# Patient Record
Sex: Female | Born: 1975 | Race: White | Hispanic: No | Marital: Married | Smoking: Current every day smoker
Health system: Southern US, Community
[De-identification: ages and names within clinical notes are randomized; demographics above are authoritative.]

## PROBLEM LIST (undated history)

## (undated) DIAGNOSIS — M51369 Other intervertebral disc degeneration, lumbar region without mention of lumbar back pain or lower extremity pain: Secondary | ICD-10-CM

## (undated) DIAGNOSIS — E785 Hyperlipidemia, unspecified: Secondary | ICD-10-CM

## (undated) DIAGNOSIS — M5136 Other intervertebral disc degeneration, lumbar region: Secondary | ICD-10-CM

## (undated) DIAGNOSIS — G629 Polyneuropathy, unspecified: Secondary | ICD-10-CM

## (undated) DIAGNOSIS — I10 Essential (primary) hypertension: Secondary | ICD-10-CM

## (undated) HISTORY — DX: Polyneuropathy, unspecified: G62.9

## (undated) HISTORY — DX: Essential (primary) hypertension: I10

## (undated) HISTORY — DX: Other intervertebral disc degeneration, lumbar region: M51.36

## (undated) HISTORY — DX: Hyperlipidemia, unspecified: E78.5

## (undated) HISTORY — DX: Other intervertebral disc degeneration, lumbar region without mention of lumbar back pain or lower extremity pain: M51.369

---

## 2003-10-27 HISTORY — PX: TUBAL LIGATION: SHX77

## 2016-06-16 DIAGNOSIS — E785 Hyperlipidemia, unspecified: Secondary | ICD-10-CM | POA: Insufficient documentation

## 2016-06-16 DIAGNOSIS — M47816 Spondylosis without myelopathy or radiculopathy, lumbar region: Secondary | ICD-10-CM | POA: Insufficient documentation

## 2016-10-21 DIAGNOSIS — Z8673 Personal history of transient ischemic attack (TIA), and cerebral infarction without residual deficits: Secondary | ICD-10-CM | POA: Insufficient documentation

## 2016-10-21 DIAGNOSIS — G459 Transient cerebral ischemic attack, unspecified: Secondary | ICD-10-CM | POA: Insufficient documentation

## 2016-11-13 DIAGNOSIS — G629 Polyneuropathy, unspecified: Secondary | ICD-10-CM | POA: Insufficient documentation

## 2017-02-23 DIAGNOSIS — I872 Venous insufficiency (chronic) (peripheral): Secondary | ICD-10-CM | POA: Insufficient documentation

## 2017-04-29 DIAGNOSIS — F32A Depression, unspecified: Secondary | ICD-10-CM | POA: Insufficient documentation

## 2017-04-29 DIAGNOSIS — F329 Major depressive disorder, single episode, unspecified: Secondary | ICD-10-CM | POA: Insufficient documentation

## 2018-04-06 DIAGNOSIS — Z720 Tobacco use: Secondary | ICD-10-CM | POA: Insufficient documentation

## 2018-11-03 ENCOUNTER — Encounter: Payer: Self-pay | Admitting: Family

## 2018-11-03 ENCOUNTER — Encounter: Payer: Self-pay | Admitting: Neurology

## 2018-11-03 ENCOUNTER — Ambulatory Visit: Payer: Medicaid Other | Admitting: Family

## 2018-11-03 VITALS — BP 146/88 | HR 65 | Temp 97.5°F | Ht 64.5 in | Wt 190.4 lb

## 2018-11-03 DIAGNOSIS — E785 Hyperlipidemia, unspecified: Secondary | ICD-10-CM

## 2018-11-03 DIAGNOSIS — Z720 Tobacco use: Secondary | ICD-10-CM

## 2018-11-03 DIAGNOSIS — F331 Major depressive disorder, recurrent, moderate: Secondary | ICD-10-CM | POA: Diagnosis not present

## 2018-11-03 DIAGNOSIS — M47816 Spondylosis without myelopathy or radiculopathy, lumbar region: Secondary | ICD-10-CM

## 2018-11-03 DIAGNOSIS — G459 Transient cerebral ischemic attack, unspecified: Secondary | ICD-10-CM | POA: Diagnosis not present

## 2018-11-03 DIAGNOSIS — I10 Essential (primary) hypertension: Secondary | ICD-10-CM

## 2018-11-03 MED ORDER — AMITRIPTYLINE HCL 25 MG PO TABS: 25.0000 mg | ORAL_TABLET | Freq: Every evening | ORAL | 1 refills | Status: DC | PRN

## 2018-11-03 MED ORDER — CYCLOBENZAPRINE HCL 10 MG PO TABS: 10.0000 mg | ORAL_TABLET | Freq: Every day | ORAL | 2 refills | Status: DC

## 2018-11-03 MED ORDER — HYDROCHLOROTHIAZIDE 12.5 MG PO TABS: 12.5000 mg | ORAL_TABLET | Freq: Every day | ORAL | 3 refills | Status: DC

## 2018-11-03 MED ORDER — CLOPIDOGREL BISULFATE 75 MG PO TABS: 75.0000 mg | ORAL_TABLET | Freq: Every day | ORAL | 1 refills | Status: DC

## 2018-11-03 NOTE — Patient Instructions (Signed)
Dizziness Dizziness is a common problem. It is a feeling of unsteadiness or light-headedness. You may feel like you are about to faint. Dizziness can lead to injury if you stumble or fall. Anyone can become dizzy, but dizziness is more common in older adults. This condition can be caused by a number of things, including medicines, dehydration, or illness. Follow these instructions at home: Eating and drinking  Drink enough fluid to keep your urine clear or pale yellow. This helps to keep you from becoming dehydrated. Try to drink more clear fluids, such as water.  Do not drink alcohol.  Limit your caffeine intake if told to do so by your health care provider. Check ingredients and nutrition facts to see if a food or beverage contains caffeine.  Limit your salt (sodium) intake if told to do so by your health care provider. Check ingredients and nutrition facts to see if a food or beverage contains sodium. Activity  Avoid making quick movements. ? Rise slowly from chairs and steady yourself until you feel okay. ? In the morning, first sit up on the side of the bed. When you feel okay, stand slowly while you hold onto something until you know that your balance is fine.  If you need to stand in one place for a long time, move your legs often. Tighten and relax the muscles in your legs while you are standing.  Do not drive or use heavy machinery if you feel dizzy.  Avoid bending down if you feel dizzy. Place items in your home so that they are easy for you to reach without leaning over. Lifestyle  Do not use any products that contain nicotine or tobacco, such as cigarettes and e-cigarettes. If you need help quitting, ask your health care provider.  Try to reduce your stress level by using methods such as yoga or meditation. Talk with your health care provider if you need help to manage your stress. General instructions  Watch your dizziness for any changes.  Take over-the-counter and  prescription medicines only as told by your health care provider. Talk with your health care provider if you think that your dizziness is caused by a medicine that you are taking.  Tell a friend or a family member that you are feeling dizzy. If he or she notices any changes in your behavior, have this person call your health care provider.  Keep all follow-up visits as told by your health care provider. This is important. Contact a health care provider if:  Your dizziness does not go away.  Your dizziness or light-headedness gets worse.  You feel nauseous.  You have reduced hearing.  You have new symptoms.  You are unsteady on your feet or you feel like the room is spinning. Get help right away if:  You vomit or have diarrhea and are unable to eat or drink anything.  You have problems talking, walking, swallowing, or using your arms, hands, or legs.  You feel generally weak.  You are not thinking clearly or you have trouble forming sentences. It may take a friend or family member to notice this.  You have chest pain, abdominal pain, shortness of breath, or sweating.  Your vision changes.  You have any bleeding.  You have a severe headache.  You have neck pain or a stiff neck.  You have a fever. These symptoms may represent a serious problem that is an emergency. Do not wait to see if the symptoms will go away. Get medical help   right away. Call your local emergency services (911 in the [place] Do not drive yourself to the hospital. Summary  Dizziness is a feeling of unsteadiness or light-headedness. This condition can be caused by a number of things, including medicines, dehydration, or illness.  Anyone can become dizzy, but dizziness is more common in older adults.  Drink enough fluid to keep your urine clear or pale yellow. Do not drink alcohol.  Avoid making quick movements if you feel dizzy. Monitor your dizziness for any changes. This information is not intended to  replace advice given to you by your health care provider. Make sure you discuss any questions you have with your health care provider. Document Released: 04/07/2001 Document Revised: 11/14/2016 Document Reviewed: 11/14/2016 Elsevier Interactive Patient Education  2019 [employer]. Hypertension Hypertension, commonly called high blood pressure, is when the force of blood pumping through the arteries is too strong. The arteries are the blood vessels that carry blood from the heart throughout the body. Hypertension forces the heart to work harder to pump blood and may cause arteries to become narrow or stiff. Having untreated or uncontrolled hypertension can cause heart attacks, strokes, kidney disease, and other problems. A blood pressure reading consists of a higher number over a lower number. Ideally, your blood pressure should be below 120/80. The first ("top") number is called the systolic pressure. It is a measure of the pressure in your arteries as your heart beats. The second ("bottom") number is called the diastolic pressure. It is a measure of the pressure in your arteries as the heart relaxes. What are the causes? The cause of this condition is not known. What increases the risk? Some risk factors for high blood pressure are under your control. Others are not. Factors you can change  Smoking.  Having type 2 diabetes mellitus, high cholesterol, or both.  Not getting enough exercise or physical activity.  Being overweight.  Having too much fat, sugar, calories, or salt (sodium) in your diet.  Drinking too much alcohol. Factors that are difficult or impossible to change  Having chronic kidney disease.  Having a family history of high blood pressure.  Age. Risk increases with age.  Race. You may be at higher risk if you are African-American.  Gender. Men are at higher risk than women before age 10. After age 77, women are at higher risk than men.  Having obstructive sleep  apnea.  Stress. What are the signs or symptoms? Extremely high blood pressure (hypertensive crisis) may cause:  Headache.  Anxiety.  Shortness of breath.  Nosebleed.  Nausea and vomiting.  Severe chest pain.  Jerky movements you cannot control (seizures). How is this diagnosed? This condition is diagnosed by measuring your blood pressure while you are seated, with your arm resting on a surface. The cuff of the blood pressure monitor will be placed directly against the skin of your upper arm at the level of your heart. It should be measured at least twice using the same arm. Certain conditions can cause a difference in blood pressure between your right and left arms. Certain factors can cause blood pressure readings to be lower or higher than normal (elevated) for a short period of time:  When your blood pressure is higher when you are in a health care provider's office than when you are at home, this is called white coat hypertension. Most people with this condition do not need medicines.  When your blood pressure is higher at home than when you are in  a health care provider's office, this is called masked hypertension. Most people with this condition may need medicines to control blood pressure. If you have a high blood pressure reading during one visit or you have normal blood pressure with other risk factors:  You may be asked to return on a different day to have your blood pressure checked again.  You may be asked to monitor your blood pressure at home for 1 week or longer. If you are diagnosed with hypertension, you may have other blood or imaging tests to help your health care provider understand your overall risk for other conditions. How is this treated? This condition is treated by making healthy lifestyle changes, such as eating healthy foods, exercising more, and reducing your alcohol intake. Your health care provider may prescribe medicine if lifestyle changes are not  enough to get your blood pressure under control, and if:  Your systolic blood pressure is above 130.  Your diastolic blood pressure is above 80. Your personal target blood pressure may vary depending on your medical conditions, your age, and other factors. Follow these instructions at home: Eating and drinking   Eat a diet that is high in fiber and potassium, and low in sodium, added sugar, and fat. An example eating plan is called the DASH (Dietary Approaches to Stop Hypertension) diet. To eat this way: ? Eat plenty of fresh fruits and vegetables. Try to fill half of your plate at each meal with fruits and vegetables. ? Eat whole grains, such as whole wheat pasta, brown rice, or whole grain bread. Fill about one quarter of your plate with whole grains. ? Eat or drink low-fat dairy products, such as skim milk or low-fat yogurt. ? Avoid fatty cuts of meat, processed or cured meats, and poultry with skin. Fill about one quarter of your plate with lean proteins, such as fish, chicken without skin, beans, eggs, and tofu. ? Avoid premade and processed foods. These tend to be higher in sodium, added sugar, and fat.  Reduce your daily sodium intake. Most people with hypertension should eat less than 1,500 mg of sodium a day.  Limit alcohol intake to no more than 1 drink a day for nonpregnant women and 2 drinks a day for men. One drink equals 12 oz of beer, 5 oz of wine, or 1 oz of hard liquor. Lifestyle   Work with your health care provider to maintain a healthy body weight or to lose weight. Ask what an ideal weight is for you.  Get at least 30 minutes of exercise that causes your heart to beat faster (aerobic exercise) most days of the week. Activities may include walking, swimming, or biking.  Include exercise to strengthen your muscles (resistance exercise), such as pilates or lifting weights, as part of your weekly exercise routine. Try to do these types of exercises for 30 minutes at least  3 days a week.  Do not use any products that contain nicotine or tobacco, such as cigarettes and e-cigarettes. If you need help quitting, ask your health care provider.  Monitor your blood pressure at home as told by your health care provider.  Keep all follow-up visits as told by your health care provider. This is important. Medicines  Take over-the-counter and prescription medicines only as told by your health care provider. Follow directions carefully. Blood pressure medicines must be taken as prescribed.  Do not skip doses of blood pressure medicine. Doing this puts you at risk for problems and can make the  medicine less effective.  Ask your health care provider about side effects or reactions to medicines that you should watch for. Contact a health care provider if:  You think you are having a reaction to a medicine you are taking.  You have headaches that keep coming back (recurring).  You feel dizzy.  You have swelling in your ankles.  You have trouble with your vision. Get help right away if:  You develop a severe headache or confusion.  You have unusual weakness or numbness.  You feel faint.  You have severe pain in your chest or abdomen.  You vomit repeatedly.  You have trouble breathing. Summary  Hypertension is when the force of blood pumping through your arteries is too strong. If this condition is not controlled, it may put you at risk for serious complications.  Your personal target blood pressure may vary depending on your medical conditions, your age, and other factors. For most people, a normal blood pressure is less than 120/80.  Hypertension is treated with lifestyle changes, medicines, or a combination of both. Lifestyle changes include weight loss, eating a healthy, low-sodium diet, exercising more, and limiting alcohol. This information is not intended to replace advice given to you by your health care provider. Make sure you discuss any questions  you have with your health care provider. Document Released: 10/12/2005 Document Revised: 09/09/2016 Document Reviewed: 09/09/2016 Elsevier Interactive Patient Education  2019 [employer]

## 2018-11-03 NOTE — Progress Notes (Signed)
Subjective:    Patient ID: Ann Anderson, female    DOB: 1975-11-30, 43 y.o.   MRN: [id number]  Chief Complaint  Patient presents with  . New Patient (Initial Visit)   Pt presents to the office today to establish care. Pt has seen a Neurologists in the past for hx TIA, but has not seen one recently due to insurance.   She had her pap and mammogram completed last year.  Depression         This is a chronic problem.  The current episode started more than 1 year ago.   The onset quality is gradual.   The problem occurs intermittently.  The problem has been waxing and waning since onset.  Associated symptoms include helplessness, hopelessness, irritable, restlessness, decreased interest and sad.  Associated symptoms include no headaches. Hyperlipidemia  This is a chronic problem. The current episode started more than 1 year ago. The problem is controlled. Recent lipid tests were reviewed and are normal. Exacerbating diseases include obesity. The current treatment provides no improvement of lipids. Risk factors for coronary artery disease include dyslipidemia and hypertension.  Arthritis  Presents for follow-up visit. She complains of pain and stiffness. The symptoms have been worsening. Affected locations include the right MCP and left MCP (back). Her pain is at a severity of 5/10.  Hypertension  The current episode started more than 1 month ago. The problem has been waxing and waning since onset. The problem is uncontrolled. Associated symptoms include malaise/fatigue and peripheral edema (at times). Pertinent negatives include no headaches. Past treatments include diuretics. The current treatment provides mild improvement.      Review of Systems  Constitutional: Positive for malaise/fatigue.  Musculoskeletal: Positive for arthritis and stiffness.  Neurological: Negative for headaches.  Psychiatric/Behavioral: Positive for depression.  All other systems reviewed and are negative.   Family  History  Problem Relation Age of Onset  . Cancer Mother        breast  . Heart disease Father     Social History   Socioeconomic History  . Marital status: Divorced    Spouse name: Not on file  . Number of children: Not on file  . Years of education: Not on file  . Highest education level: Not on file  Occupational History  . Not on file  Social Needs  . Financial resource strain: Not on file  . Food insecurity:    Worry: Not on file    Inability: Not on file  . Transportation needs:    Medical: Not on file    Non-medical: Not on file  Tobacco Use  . Smoking status: Current Every Day Smoker    Types: Cigarettes  . Smokeless tobacco: Never Used  Substance and Sexual Activity  . Alcohol use: Yes    Comment: Social  . Drug use: Never  . Sexual activity: Not on file  Lifestyle  . Physical activity:    Days per week: Not on file    Minutes per session: Not on file  . Stress: Not on file  Relationships  . Social connections:    Talks on phone: Not on file    Gets together: Not on file    Attends religious service: Not on file    Active member of club or organization: Not on file    Attends meetings of clubs or organizations: Not on file    Relationship status: Not on file  Other Topics Concern  . Not on file  Social History  Narrative  . Not on file       Objective:   Physical Exam Vitals signs reviewed.  Constitutional:      General: She is irritable. She is not in acute distress.    Appearance: She is well-developed.  HENT:     Head: Normocephalic and atraumatic.     Right Ear: External ear normal.  Eyes:     Pupils: Pupils are equal, round, and reactive to light.  Neck:     Musculoskeletal: Normal range of motion and neck supple.     Thyroid: No thyromegaly.  Cardiovascular:     Rate and Rhythm: Normal rate and regular rhythm.     Heart sounds: Normal heart sounds. No murmur.  Pulmonary:     Effort: Pulmonary effort is normal. No respiratory  distress.     Breath sounds: Normal breath sounds. No wheezing.  Abdominal:     General: Bowel sounds are normal. There is no distension.     Palpations: Abdomen is soft.     Tenderness: There is no abdominal tenderness.  Musculoskeletal: Normal range of motion.        General: No tenderness.  Skin:    General: Skin is warm and dry.  Neurological:     Mental Status: She is alert and oriented to person, place, and time.     Cranial Nerves: No cranial nerve deficit.     Deep Tendon Reflexes: Reflexes are normal and symmetric.  Psychiatric:        Behavior: Behavior normal.        Thought Content: Thought content normal.        Judgment: Judgment normal.       BP (!) 146/88   Pulse 65   Temp (!) 97.5 F (36.4 C) (Oral)   Ht 5' 4.5" (1.638 m)   Wt 190 lb 6.4 oz (86.4 kg)   LMP 10/30/2018   BMI 32.18 kg/m      Assessment & Plan:  Ann Anderson comes in today with chief complaint of New Patient (Initial Visit)   Diagnosis and orders addressed:  1. TIA (transient ischemic attack) - CMP14+EGFR - CBC with Differential/Platelet - Ambulatory referral to Neurology  2. Arthritis, lumbar spine - CMP14+EGFR - CBC with Differential/Platelet  3. Hyperlipidemia, unspecified hyperlipidemia type - CMP14+EGFR - CBC with Differential/Platelet - Lipid panel  4. Moderate episode of recurrent major depressive disorder ([place]) - CMP14+EGFR - CBC with Differential/Platelet - Ambulatory referral to Psychiatry  5. Tobacco abuse - CMP14+EGFR - CBC with Differential/Platelet  6. Essential hypertension We will stop lasix since she is only taking every other day and start HCTZ 12.5 mg -Daily blood pressure log given with instructions on how to fill out and told to bring to next visit -Dash diet information given -Exercise encouraged - Stress Management  -Continue current meds -RTO in 2 weeks  - CMP14+EGFR - hydrochlorothiazide (HYDRODIURIL) 12.5 MG tablet; Take 1 tablet (12.5 mg  total) by mouth daily.  Dispense: 90 tablet; Refill: 3   Labs pending Health Maintenance reviewed Diet and exercise encouraged  Follow up plan: 2 weeks to recheck HTN   [name], FNP

## 2018-11-04 ENCOUNTER — Other Ambulatory Visit: Payer: Self-pay | Admitting: Family

## 2018-11-04 LAB — CMP14+EGFR
ALT: 17 IU/L (ref 0–32)
AST: 14 IU/L (ref 0–40)
Albumin/Globulin Ratio: 1.8 (ref 1.2–2.2)
Albumin: 4.4 g/dL (ref 3.5–5.5)
Alkaline Phosphatase: 71 IU/L (ref 39–117)
BILIRUBIN TOTAL: 0.4 mg/dL (ref 0.0–1.2)
BUN/Creatinine Ratio: 15 (ref 9–23)
BUN: 12 mg/dL (ref 6–24)
CO2: 21 mmol/L (ref 20–29)
Calcium: 9.1 mg/dL (ref 8.7–10.2)
Chloride: 104 mmol/L (ref 96–106)
Creatinine, Ser: 0.82 mg/dL (ref 0.57–1.00)
GFR calc Af Amer: 101 mL/min/{1.73_m2} (ref >59)
GFR calc non Af Amer: 88 mL/min/{1.73_m2} (ref >59)
Globulin, Total: 2.4 g/dL (ref 1.5–4.5)
Glucose: 89 mg/dL (ref 65–99)
POTASSIUM: 4.5 mmol/L (ref 3.5–5.2)
SODIUM: 142 mmol/L (ref 134–144)
Total Protein: 6.8 g/dL (ref 6.0–8.5)

## 2018-11-04 LAB — CBC WITH DIFFERENTIAL/PLATELET
BASOS ABS: 0.1 10*3/uL (ref 0.0–0.2)
Basos: 1 %
EOS (ABSOLUTE): 0.3 10*3/uL (ref 0.0–0.4)
Eos: 4 %
Hematocrit: 38.7 % (ref 34.0–46.6)
Hemoglobin: 13.3 g/dL (ref 11.1–15.9)
Immature Grans (Abs): 0 10*3/uL (ref 0.0–0.1)
Immature Granulocytes: 0 %
Lymphocytes Absolute: 2.5 10*3/uL (ref 0.7–3.1)
Lymphs: 37 %
MCH: 28 pg (ref 26.6–33.0)
MCHC: 34.4 g/dL (ref 31.5–35.7)
MCV: 82 fL (ref 79–97)
Monocytes Absolute: 0.5 10*3/uL (ref 0.1–0.9)
Monocytes: 7 %
Neutrophils Absolute: 3.5 10*3/uL (ref 1.4–7.0)
Neutrophils: 51 %
Platelets: 266 10*3/uL (ref 150–450)
RBC: 4.75 x10E6/uL (ref 3.77–5.28)
RDW: 13.8 % (ref 11.7–15.4)
WBC: 6.9 10*3/uL (ref 3.4–10.8)

## 2018-11-04 LAB — LIPID PANEL
Chol/HDL Ratio: 5.6 ratio — ABNORMAL HIGH (ref 0.0–4.4)
Cholesterol, Total: 234 mg/dL — ABNORMAL HIGH (ref 100–199)
HDL: 42 mg/dL (ref >39)
LDL Calculated: 149 mg/dL — ABNORMAL HIGH (ref 0–99)
Triglycerides: 215 mg/dL — ABNORMAL HIGH (ref 0–149)
VLDL Cholesterol Cal: 43 mg/dL — ABNORMAL HIGH (ref 5–40)

## 2018-11-04 MED ORDER — ATORVASTATIN CALCIUM 20 MG PO TABS: 20.0000 mg | ORAL_TABLET | Freq: Every day | ORAL | 3 refills | Status: DC

## 2018-11-17 ENCOUNTER — Encounter: Payer: Self-pay | Admitting: Family

## 2018-11-17 ENCOUNTER — Ambulatory Visit (INDEPENDENT_AMBULATORY_CARE_PROVIDER_SITE_OTHER): Payer: Medicaid Other | Admitting: Family

## 2018-11-17 VITALS — BP 118/80 | HR 72 | Temp 97.3°F | Ht 64.5 in | Wt 190.6 lb

## 2018-11-17 DIAGNOSIS — Z720 Tobacco use: Secondary | ICD-10-CM | POA: Diagnosis not present

## 2018-11-17 DIAGNOSIS — F331 Major depressive disorder, recurrent, moderate: Secondary | ICD-10-CM | POA: Diagnosis not present

## 2018-11-17 DIAGNOSIS — G459 Transient cerebral ischemic attack, unspecified: Secondary | ICD-10-CM

## 2018-11-17 DIAGNOSIS — E785 Hyperlipidemia, unspecified: Secondary | ICD-10-CM

## 2018-11-17 DIAGNOSIS — M47816 Spondylosis without myelopathy or radiculopathy, lumbar region: Secondary | ICD-10-CM

## 2018-11-17 LAB — BMP8+EGFR
BUN/Creatinine Ratio: 13 (ref 9–23)
BUN: 10 mg/dL (ref 6–24)
CO2: 22 mmol/L (ref 20–29)
Calcium: 9 mg/dL (ref 8.7–10.2)
Chloride: 98 mmol/L (ref 96–106)
Creatinine, Ser: 0.76 mg/dL (ref 0.57–1.00)
GFR calc Af Amer: 111 mL/min/{1.73_m2} (ref >59)
GFR calc non Af Amer: 96 mL/min/{1.73_m2} (ref >59)
Glucose: 90 mg/dL (ref 65–99)
Potassium: 3.8 mmol/L (ref 3.5–5.2)
Sodium: 138 mmol/L (ref 134–144)

## 2018-11-17 NOTE — Progress Notes (Signed)
Subjective:    Patient ID: Ann Anderson, female    DOB: [birth year], 43 y.o.   MRN: [id number]  Chief Complaint  Patient presents with  . Medical Management of Chronic Issues    two week recheck  . forms from [facility]   PT presents to the office today to recheck HTN and to completed [employer] paperwork. She states she had a TIA in 2018 and has to complete the paper work due to this.  Hypertension  This is a chronic problem. The current episode started more than 1 year ago. The problem has been resolved since onset. The problem is controlled. Associated symptoms include malaise/fatigue and peripheral edema ("at times"). Pertinent negatives include no headaches. The current treatment provides moderate improvement.  Depression         This is a chronic problem.  The current episode started more than 1 year ago.   The onset quality is gradual.   The problem occurs intermittently.  Associated symptoms include fatigue, restlessness and decreased interest.  Associated symptoms include no headaches. Back Pain  This is a chronic problem. The current episode started more than 1 year ago. The problem occurs intermittently. The problem has been waxing and waning since onset. The pain is present in the lumbar spine. The pain is moderate. Associated symptoms include weakness. Pertinent negatives include no headaches.      Review of Systems  Constitutional: Positive for fatigue and malaise/fatigue.  Musculoskeletal: Positive for back pain.  Neurological: Positive for weakness. Negative for headaches.  Psychiatric/Behavioral: Positive for depression.  All other systems reviewed and are negative.      Objective:   Physical Exam Vitals signs reviewed.  Constitutional:      General: She is not in acute distress.    Appearance: She is well-developed.  HENT:     Head: Normocephalic and atraumatic.     Right Ear: Tympanic membrane normal.     Left Ear: Tympanic membrane normal.  Eyes:     Pupils: Pupils are  equal, round, and reactive to light.  Neck:     Musculoskeletal: Normal range of motion and neck supple.     Thyroid: No thyromegaly.  Cardiovascular:     Rate and Rhythm: Normal rate and regular rhythm.     Heart sounds: Normal heart sounds. No murmur.  Pulmonary:     Effort: Pulmonary effort is normal. No respiratory distress.     Breath sounds: Normal breath sounds. No wheezing.  Abdominal:     General: Bowel sounds are normal. There is no distension.     Palpations: Abdomen is soft.     Tenderness: There is no abdominal tenderness.  Musculoskeletal: Normal range of motion.        General: No tenderness.  Skin:    General: Skin is warm and dry.  Neurological:     Mental Status: She is alert and oriented to person, place, and time.     Cranial Nerves: No cranial nerve deficit.     Deep Tendon Reflexes: Reflexes are normal and symmetric.  Psychiatric:        Behavior: Behavior normal.        Thought Content: Thought content normal.        Judgment: Judgment normal.       BP 118/80   Pulse 72   Temp (!) 97.3 F (36.3 C) (Oral)   Ht 5' 4.5" (1.638 m)   Wt 190 lb 9.6 oz (86.5 kg)   LMP 10/30/2018  BMI 32.21 kg/m      Assessment & Plan:  Ann Anderson comes in today with chief complaint of Medical Management of Chronic Issues (two week recheck) and forms from [employer]   Diagnosis and orders addressed:  1. TIA (transient ischemic attack) - BMP8+EGFR  2. Hyperlipidemia, unspecified hyperlipidemia type - BMP8+EGFR  3. Moderate episode of recurrent major depressive disorder ([facility]) - BMP8+EGFR  4. Tobacco abuse  - BMP8+EGFR  5. Arthritis, lumbar spine - BMP8+EGFR   Labs pending DMV paperwork completed Health Maintenance reviewed Diet and exercise encouraged  Follow up plan: 6 months   Christy Hawks, FNP  

## 2018-11-17 NOTE — Patient Instructions (Signed)
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
## 2018-12-26 ENCOUNTER — Encounter (HOSPITAL_COMMUNITY): Payer: Self-pay | Admitting: Psychiatry

## 2018-12-26 ENCOUNTER — Ambulatory Visit (INDEPENDENT_AMBULATORY_CARE_PROVIDER_SITE_OTHER): Payer: Medicaid Other | Admitting: Psychiatry

## 2018-12-26 VITALS — BP 127/85 | HR 73 | Ht 64.5 in | Wt 195.0 lb

## 2018-12-26 DIAGNOSIS — F331 Major depressive disorder, recurrent, moderate: Secondary | ICD-10-CM | POA: Insufficient documentation

## 2018-12-26 MED ORDER — DULOXETINE HCL 60 MG PO CPEP: 60.0000 mg | ORAL_CAPSULE | Freq: Every day | ORAL | 0 refills | Status: DC

## 2018-12-26 MED ORDER — DULOXETINE HCL 30 MG PO CPEP: 30.0000 mg | ORAL_CAPSULE | Freq: Every day | ORAL | 0 refills | Status: DC

## 2018-12-26 NOTE — Patient Instructions (Signed)
1. Discontinue amitriptyline  2. Start duloxetine 30 mg daily for one week, then 60 mg daily  3. Referral to therapy 4. Return to clinic in one month for 30 mins 5. Check TSH by your primary care doctor

## 2019-01-09 ENCOUNTER — Ambulatory Visit: Payer: Self-pay | Admitting: Neurology
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
## 2019-01-20 ENCOUNTER — Ambulatory Visit: Payer: Self-pay | Admitting: Neurology

## 2019-01-26 ENCOUNTER — Encounter (HOSPITAL_COMMUNITY): Payer: Self-pay | Admitting: Psychiatry

## 2019-01-26 ENCOUNTER — Other Ambulatory Visit: Payer: Self-pay

## 2019-01-27 NOTE — Progress Notes (Signed)
This encounter was created in error - please disregard.

## 2019-01-30 ENCOUNTER — Other Ambulatory Visit: Payer: Self-pay

## 2019-01-30 ENCOUNTER — Ambulatory Visit (INDEPENDENT_AMBULATORY_CARE_PROVIDER_SITE_OTHER): Payer: Self-pay | Admitting: Psychiatry

## 2019-01-30 ENCOUNTER — Encounter (HOSPITAL_COMMUNITY): Payer: Self-pay | Admitting: Psychiatry

## 2019-01-30 DIAGNOSIS — F331 Major depressive disorder, recurrent, moderate: Secondary | ICD-10-CM
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
## 2019-03-13 ENCOUNTER — Ambulatory Visit (INDEPENDENT_AMBULATORY_CARE_PROVIDER_SITE_OTHER): Payer: Self-pay | Admitting: Family

## 2019-03-13 ENCOUNTER — Other Ambulatory Visit: Payer: Self-pay

## 2019-03-13 ENCOUNTER — Encounter: Payer: Self-pay | Admitting: Family

## 2019-03-13 DIAGNOSIS — R251 Tremor, unspecified: Secondary | ICD-10-CM

## 2019-03-13 DIAGNOSIS — F331 Major depressive disorder, recurrent, moderate: Secondary | ICD-10-CM

## 2019-03-13 DIAGNOSIS — F411 Generalized anxiety disorder: Secondary | ICD-10-CM

## 2019-03-13 DIAGNOSIS — I10 Essential (primary) hypertension: Secondary | ICD-10-CM

## 2019-03-13 MED ORDER — DULOXETINE HCL 30 MG PO CPEP: 30.0000 mg | ORAL_CAPSULE | Freq: Every day | ORAL | 0 refills | Status: DC

## 2019-03-13 MED ORDER — AMITRIPTYLINE HCL 25 MG PO TABS: 25.0000 mg | ORAL_TABLET | Freq: Every evening | ORAL | 1 refills | Status: DC | PRN

## 2019-03-13 MED ORDER — ESCITALOPRAM OXALATE 5 MG PO TABS: ORAL_TABLET | ORAL | 0 refills | Status: DC

## 2019-03-13 MED ORDER — HYDROCHLOROTHIAZIDE 12.5 MG PO TABS: 12.5000 mg | ORAL_TABLET | Freq: Every day | ORAL | 3 refills | Status: DC

## 2019-03-13 MED ORDER — CLOPIDOGREL BISULFATE 75 MG PO TABS: 75.0000 mg | ORAL_TABLET | Freq: Every day | ORAL | 1 refills | Status: DC

## 2019-03-13 MED ORDER — ATORVASTATIN CALCIUM 20 MG PO TABS: 20.0000 mg | ORAL_TABLET | Freq: Every day | ORAL | 3 refills | Status: DC

## 2019-03-13 NOTE — Progress Notes (Signed)
Virtual Visit via telephone Note  I connected with Ann Anderson on 03/13/19 at 12:04 pm by telephone and verified that I am speaking with the correct person using two identifiers. Ann Anderson is currently located at home  and no one is currently with her during visit. The provider, Jannifer Rodneyhristy Tamakia Porto, FNP is located in their office at time of visit.  I discussed the limitations, risks, security and privacy concerns of performing an evaluation and management service by telephone and the availability of in person appointments. I also discussed with the patient that there may be a patient responsible charge related to this service. The patient expressed understanding and agreed to proceed.   History and Present Illness:  Pt calls to the office today with intermittent hand shaking and nervousness that started about a month ago. She states over the last few days it has become worse and she feels like her entire body is shaking and has a "nervous feeling". She states she has not been able to sleep well over the past few nights because of this.   She started taking Cymbalta a little over a month ago. [facility].  Depression         This is a chronic problem.  The current episode started more than 1 month ago.   The onset quality is gradual.   The problem occurs intermittently.  Associated symptoms include fatigue, helplessness, hopelessness, irritable, restlessness, decreased interest and sad.  Past treatments include SNRIs - Serotonin and norepinephrine reuptake inhibitors.  Compliance with treatment is good. Hypertension  This is a chronic problem. The current episode started more than 1 year ago. The problem has been resolved since onset. The problem is controlled. Pertinent negatives include no peripheral edema or shortness of breath. Risk factors for coronary artery disease include dyslipidemia, obesity and sedentary lifestyle. The current treatment  provides moderate improvement.        Review of Systems  Constitutional: Positive for fatigue.  Respiratory: Negative for shortness of breath.   Psychiatric/Behavioral: Positive for depression.  All other systems reviewed and are negative.    Observations/Objective: No SOB or distress   Assessment and Plan: Ann Anderson comes in today with chief complaint of No chief complaint on file.   Diagnosis and orders addressed:  1. MDD (major depressive disorder), recurrent episode, moderate (HCC) - DULoxetine (CYMBALTA) 30 MG capsule; Take 1 capsule (30 mg total) by mouth daily.  Dispense: 30 capsule; Refill: 0 - escitalopram (LEXAPRO) 5 MG tablet; Take 1 tablet (5 mg total) by mouth daily for 7 days, THEN 2 tablets (10 mg total) daily for 14 days.  Dispense: 35 tablet; Refill: 0  2. Tremor  3. GAD (generalized anxiety disorder)   After discussion, her tremors and increased anxiety started after starting Cymbalta. We will decrease to 30 mg over for the next 2 weeks and then stop. She will start Lexapro 5 mg for one week and then increase to 10 mg.  Stress management discussed  Follow up in 6 weeks       I discussed the assessment and treatment plan with the patient. The patient was provided an opportunity to ask questions and all were answered. The patient agreed with the plan and demonstrated an understanding of the instructions.   The patient was advised to call back or seek an in-person evaluation if the symptoms worsen or if the condition fails to improve as anticipated.  The above assessment and management  plan was discussed with the patient. The patient verbalized understanding of and has agreed to the management plan. Patient is aware to call the clinic if symptoms persist or worsen. Patient is aware when to return to the clinic for a follow-up visit. Patient educated on when it is appropriate to go to the emergency department.   Time call ended:  12:28 pm  I provided 24  minutes of non-face-to-face time during this encounter.    [name], FNP

## 2019-03-28 ENCOUNTER — Ambulatory Visit (INDEPENDENT_AMBULATORY_CARE_PROVIDER_SITE_OTHER): Payer: Medicaid Other | Admitting: Family

## 2019-03-28 ENCOUNTER — Encounter: Payer: Self-pay | Admitting: Family

## 2019-03-28 ENCOUNTER — Other Ambulatory Visit: Payer: Self-pay

## 2019-03-28 DIAGNOSIS — Z09 Encounter for follow-up examination after completed treatment for conditions other than malignant neoplasm: Secondary | ICD-10-CM

## 2019-03-28 DIAGNOSIS — Z720 Tobacco use: Secondary | ICD-10-CM

## 2019-03-28 DIAGNOSIS — G459 Transient cerebral ischemic attack, unspecified: Secondary | ICD-10-CM

## 2019-03-28 NOTE — Progress Notes (Signed)
   Virtual Visit via telephone Note  Attempted to call pt at 3:08 pm, no answer, left voicemail.   I connected with Marcelino Duster on 03/28/19 at 3:17 pm by telephone and verified that I am speaking with the correct person using two identifiers. Daleena Campuzano is currently located at home and no one  is currently with her during visit. The provider, [name], FNP is located in their office at time of visit.  I discussed the limitations, risks, security and privacy concerns of performing an evaluation and management service by telephone and the availability of in person appointments. I also discussed with the patient that there may be a patient responsible charge related to this service. The patient expressed understanding and agreed to proceed.   History and Present Illness:  HPI PT calls the office today for hospital follow up. She states she went to the ED on 03/26/19 with left arm and left leg numbness and weakness that had started on the evening of  03/24/19. She was diagnosed with a TIA. She reports the CT scan was negative.   She has neurologists appt for next week.   She reports the weakness and numbness has improved, but still has some slight weakness.   Review of Systems  Neurological: Positive for weakness.  All other systems reviewed and are negative.    Observations/Objective: No SOB or distress   Assessment and Plan: 1. Hospital discharge follow-up  2. TIA (transient ischemic attack)  3. Tobacco abuse  Keep Neurologists appt Smoking cessation discussed Will give note for work until 04/04/19 because she is still having weakness Continue medications and need good control of BP RTO in 3 months      I discussed the assessment and treatment plan with the patient. The patient was provided an opportunity to ask questions and all were answered. The patient agreed with the plan and demonstrated an understanding of the instructions.   The patient was advised to call  back or seek an in-person evaluation if the symptoms worsen or if the condition fails to improve as anticipated.  The above assessment and management plan was discussed with the patient. The patient verbalized understanding of and has agreed to the management plan. Patient is aware to call the clinic if symptoms persist or worsen. Patient is aware when to return to the clinic for a follow-up visit. Patient educated on when it is appropriate to go to the emergency department.   Time call ended:  3:33 pm  I provided 16 minutes of non-face-to-face time during this encounter.    [name], FNP

## 2019-03-31 NOTE — Progress Notes (Addendum)
Virtual Visit via Video Note The purpose of this virtual visit is to provide medical care while limiting exposure to the novel coronavirus.    Consent was obtained for video visit:  Yes Answered questions that patient had about telehealth interaction:  Yes I discussed the limitations, risks, security and privacy concerns of performing an evaluation and management service by telemedicine. I also discussed with the patient that there may be a patient responsible charge related to this service. The patient expressed understanding and agreed to proceed.  Pt location: Home Physician Location: Home Name of referring provider:  Junie Spencer, FNP I connected with Ann Anderson at patients initiation/request on 04/03/2019 at 11:10 AM EDT by video enabled telemedicine application and verified that I am speaking with the correct person using two identifiers. Pt MRN:  [id number] Pt DOB:  Aug 12, 1976 Video Participants:  Ann Anderson   History of Present Illness:  Ann Anderson is a 43 year old Caucasian woman with hypertension and hyperlipidemia and anxiety who presents for transient ischemic attack.  History supplemented by hospital, prior neurologist and referring provider notes.  She started having habitual episodes of left sided numbness and weakness in 2015.  She has had a total of 5 episodes.  MRIs have always been negative for stroke.  In 2018, she had associated cognitive and speech difficulty.  She was discharged on Plavix.  Over the past year, she was started on Lipitor 20mg  and HCTZ.  She was admitted to [facility] last week for another habitual episode presenting as acute onset left sided numbness of face, arm and leg as well as left arm and leg weakness. She also experienced anxiety attack with chest pain. When symptoms did not resolve after 2 days, she presented to the hospital for further evaluation.  CT of head showed on acute abnormality.  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  Echocardiogram showed EF 63% with no significant valvular abnormalities.  EKG demonstrated normal sinus rhythm.  Cardiac workup otherwise negative as well.  UA and urine drug screen were negative.  Hgb A1c was 5.5.  She was continued on Plavix and Lipitor as well as HCTZ.  She reports slight left occipital headache associated with this last episode.  Otherwise, she denies any history of headaches.  Episodes usually last several hours.  Following this past episode, she still reports mild residual left upper and lower extremity weakness.  LDL in 2019 was 39.1  She also reports history of lower extremity numbness with burning and weakness causing several falls.  She has chronic back pain with lumbar degenerative disease and sciatica.  Symptoms fluctuate and sometimes requires use of a cane.  She has been followed by neurologists since 2017.  In 2017, B12 was 442, TSH was 2.55 and Hgb A1c was 5.5.  She was last seen by neurology in [place] in August 2019.  Blood work revealed B12 404, TSH 3.32, ANA negative, RF <14, Sed Rate 16.  NCV-EMG was recommended.  Past Medical History: Past Medical History:  Diagnosis Date   Degenerative disc disease, lumbar    Hyperlipidemia    Hypertension    Neuropathy     Medications: Outpatient Encounter Medications as of 04/03/2019  Medication Sig   amitriptyline (ELAVIL) 25  MG tablet Take 1 tablet (25 mg total) by mouth at bedtime as needed for sleep.   atorvastatin (LIPITOR) 20 MG tablet Take 1 tablet (20 mg total) by mouth daily.   clopidogrel (PLAVIX) 75 MG tablet Take 1 tablet (75 mg total) by mouth daily.   cyclobenzaprine (FLEXERIL) 10 MG tablet Take 1 tablet (10 mg  total) by mouth daily.   escitalopram (LEXAPRO) 5 MG tablet Take 1 tablet (5 mg total) by mouth daily for 7 days, THEN 2 tablets (10 mg total) daily for 14 days.   hydrochlorothiazide (HYDRODIURIL) 12.5 MG tablet Take 1 tablet (12.5 mg total) by mouth daily.   ibuprofen (ADVIL,MOTRIN) 800 MG tablet Take by mouth.   vitamin E 100 UNIT capsule Take 100 Units by mouth daily.   No facility-administered encounter medications on file as of 04/03/2019.     Allergies: Allergies  Allergen Reactions   Hydralazine Hives   Nitrofurantoin Hives    Family History: Family History  Problem Relation Age of Onset   Cancer Mother        breast   Heart disease Father    Dementia Father     Social History: Social History   Socioeconomic History   Marital status: Divorced    Spouse name: Not on file   Number of children: Not on file   Years of education: Not on file   Highest education level: Not on file  Occupational History   Not on file  Social Needs   Financial resource strain: Not on file   Food insecurity:    Worry: Not on file    Inability: Not on file   Transportation needs:    Medical: Not on file    Non-medical: Not on file  Tobacco Use   Smoking status: Current Every Day Smoker    Packs/day: 1.00    Years: 6.00    Pack years: 6.00    Types: Cigarettes   Smokeless tobacco: Never Used   Tobacco comment: Restarted 2 years ago and trying to cut back.   Substance and Sexual Activity   Alcohol use: Yes    Comment: Social   Drug use: Never   Sexual activity: Not Currently    Birth control/protection: Surgical  Lifestyle   Physical activity:    Days per week: Not on file    Minutes per session: Not on file   Stress: Not on file  Relationships   Social connections:    Talks on phone: Not on file    Gets together: Not on file    Attends religious service: Not on file    Active member of club or organization: Not on file    Attends meetings of  clubs or organizations: Not on file    Relationship status: Not on file   Intimate partner violence:    Fear of current or ex partner: Not on file    Emotionally abused: Not on file    Physically abused: Not on file    Forced sexual activity: Not on file  Other Topics Concern   Not on file  Social History Narrative   Not on file    Observations/Objective:   Height 5' 4.5" (1.638 m). No acute distress.  Alert and oriented.  Speech fluent and not dysarthric.  Language intact.  Face symmetric.    Assessment and Plan:   1.  Recurrent episodes of left sided numbness and weakness. Given the habitual semiology of symptoms over the past 5 years, as well  as no focal arterial stenosis to explain such symptoms, suspicion for TIAs is low.  Consider hemiplegic migraine, which would be diagnosis of exclusion.  She reports no history of headaches.  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 2.  Possible neuropathy in lower extremities. 3.  Hypertension 4.  Tobacco use disorder  1.  We will check NCV-EMG of lower extremities to evaluate for neuropathy. 2.  Continue Plavix 75mg  daily, Lipitor (LDL goal less than 70) and blood pressure management as per PCP 3.  Smoking cessation recommended 4.  As my suspicion for stroke is low, she is cleared to return to work. 5.  Follow up in office after testing.  Follow Up Instructions:    -I discussed the assessment and treatment plan with the patient. The patient was provided an opportunity to ask questions and all were answered. The patient agreed with the plan and demonstrated an understanding of the instructions.   The patient was advised to call back or seek an in-person evaluation if the symptoms worsen or if the condition fails to improve as anticipated.   Cira ServantAdam Robert Pilar Corrales, DO

## 2019-03-31 NOTE — Progress Notes (Signed)
Faxed records request to [facility] for any notes and imaging reports concerning TIA for upcoming NP consultation.

## 2019-04-03 ENCOUNTER — Telehealth (INDEPENDENT_AMBULATORY_CARE_PROVIDER_SITE_OTHER): Payer: Medicaid Other | Admitting: Neurology

## 2019-04-03 ENCOUNTER — Other Ambulatory Visit: Payer: Self-pay

## 2019-04-03 ENCOUNTER — Encounter: Payer: Self-pay | Admitting: Neurology

## 2019-04-03 VITALS — Ht 64.5 in

## 2019-04-03 DIAGNOSIS — I10 Essential (primary) hypertension: Secondary | ICD-10-CM

## 2019-04-03 DIAGNOSIS — F17200 Nicotine dependence, unspecified, uncomplicated: Secondary | ICD-10-CM

## 2019-04-03 DIAGNOSIS — G629 Polyneuropathy, unspecified: Secondary | ICD-10-CM

## 2019-04-03 DIAGNOSIS — R2990 Unspecified symptoms and signs involving the nervous system: Secondary | ICD-10-CM

## 2019-04-03 DIAGNOSIS — I639 Cerebral infarction, unspecified: Secondary | ICD-10-CM

## 2019-04-03 NOTE — Addendum Note (Signed)
Addended by: Clois Comber on: 04/03/2019 11:59 AM   Modules accepted: Orders

## 2019-04-03 NOTE — Patient Instructions (Signed)
1.  We will check nerve study of legs.  No labs are recommended at this time (pending results of nerve study) 2.  Continue Plavix, Lipitor and blood pressure medications 3.  Quit smoking 4.  Follow up after testing in office

## 2019-04-04 ENCOUNTER — Ambulatory Visit: Payer: Self-pay | Admitting: Neurology

## 2019-04-06 ENCOUNTER — Ambulatory Visit (INDEPENDENT_AMBULATORY_CARE_PROVIDER_SITE_OTHER): Payer: Self-pay | Admitting: Nurse Practitioner

## 2019-04-06 ENCOUNTER — Encounter: Payer: Self-pay | Admitting: Nurse Practitioner

## 2019-04-06 DIAGNOSIS — K591 Functional diarrhea: Secondary | ICD-10-CM

## 2019-04-06 DIAGNOSIS — R110 Nausea: Secondary | ICD-10-CM

## 2019-04-06 NOTE — Progress Notes (Signed)
   Virtual Visit via telephone Note  I connected with Ann Anderson on 04/06/19 at 3:00 PM by telephone and verified that I am speaking with the correct person using two identifiers. Ann Anderson is currently located at home and her husand is currently with her during visit. The provider, Mary-Margaret Hassell Done, FNP is located in their office at time of visit.  I discussed the limitations, risks, security and privacy concerns of performing an evaluation and management service by telephone and the availability of in person appointments. I also discussed with the patient that there may be a patient responsible charge related to this service. The patient expressed understanding and agreed to proceed.   History and Present Illness:   Chief Complaint: Nausea   HPI Patient calls in today c/o nausea and vomiting that started earlier today. Has some diarrhea. They sent her home from work because they are afraid she has covid. Feels some better since she got home.   Review of Systems  Constitutional: Negative for fever.  HENT: Negative.   Respiratory: Negative.   Cardiovascular: Negative.   Gastrointestinal: Positive for abdominal pain (cramping) and nausea.  Neurological: Negative.   Psychiatric/Behavioral: Negative.   All other systems reviewed and are negative.    Observations/Objective: Alert and oriented- answers all questions appropriately No distress  Assessment and Plan: Ann Anderson in today with chief complaint of Nausea   1. Functional diarrhea Imodium AD Force fluids  2. Nausea zofran as previously rx- patient says she has some at home. Avoid spicy and greasy foods Bland diet  Follow Up Instructions: prn    I discussed the assessment and treatment plan with the patient. The patient was provided an opportunity to ask questions and all were answered. The patient agreed with the plan and demonstrated an understanding of the instructions.   The patient was advised to  call back or seek an in-person evaluation if the symptoms worsen or if the condition fails to improve as anticipated.  The above assessment and management plan was discussed with the patient. The patient verbalized understanding of and has agreed to the management plan. Patient is aware to call the clinic if symptoms persist or worsen. Patient is aware when to return to the clinic for a follow-up visit. Patient educated on when it is appropriate to go to the emergency department.   Time call ended:  3:12  I provided 12 min minutes of non-face-to-face time during this encounter.    Mary-Margaret Hassell Done, FNP

## 2019-04-25 ENCOUNTER — Encounter: Payer: Self-pay | Admitting: Neurology

## 2019-04-25 ENCOUNTER — Other Ambulatory Visit: Payer: Self-pay

## 2019-04-25 ENCOUNTER — Ambulatory Visit (INDEPENDENT_AMBULATORY_CARE_PROVIDER_SITE_OTHER): Payer: Self-pay | Admitting: Neurology

## 2019-04-25 DIAGNOSIS — G629 Polyneuropathy, unspecified: Secondary | ICD-10-CM

## 2019-04-25 NOTE — Procedures (Signed)
[facility]  [address], [address]  [place], [place] [address] Tel: [phone] Fax:  [phone] Test Date:  04/25/2019  Patient: Ann Anderson DOB: April 13, 1976 Physician: Narda Amber, DO  Sex: Female Height: 5\' 4"  Ref Phys: Metta Clines, DO  ID#: [id number] Temp: 35.0C Technician:    Patient Complaints: This is a 43 year old female referred for evaluation of bilateral leg paresthesias.  NCV & EMG Findings: Electrodiagnostic testing was prematurely terminated at patient's request due to pain.    Impression: Incomplete study.   ___________________________ Narda Amber, DO    Nerve Conduction Studies Anti Sensory Summary Table   Site NR Peak (ms) Norm Peak (ms) P-T Amp (V) Norm P-T Amp  Right Sup Peroneal Anti Sensory (Ant Lat Mall)  34C  12 cm    2.3 <4.5 7.7 >5      Waveforms:

## 2019-04-28 ENCOUNTER — Telehealth: Payer: Self-pay | Admitting: Family

## 2019-04-28 NOTE — Telephone Encounter (Signed)
Pt aware.

## 2019-04-28 NOTE — Telephone Encounter (Signed)
LM 7/3-jhb

## 2019-05-02 ENCOUNTER — Ambulatory Visit: Payer: Self-pay | Admitting: Family

## 2019-05-02 ENCOUNTER — Encounter: Payer: Self-pay | Admitting: Family

## 2019-05-02 ENCOUNTER — Other Ambulatory Visit: Payer: Self-pay

## 2019-05-02 ENCOUNTER — Telehealth: Payer: Self-pay

## 2019-05-02 VITALS — BP 116/74 | HR 77 | Temp 97.3°F | Ht 64.5 in | Wt 198.0 lb

## 2019-05-02 DIAGNOSIS — F17200 Nicotine dependence, unspecified, uncomplicated: Secondary | ICD-10-CM

## 2019-05-02 DIAGNOSIS — R296 Repeated falls: Secondary | ICD-10-CM

## 2019-05-02 DIAGNOSIS — R200 Anesthesia of skin: Secondary | ICD-10-CM

## 2019-05-02 DIAGNOSIS — R29898 Other symptoms and signs involving the musculoskeletal system: Secondary | ICD-10-CM

## 2019-05-02 DIAGNOSIS — Z720 Tobacco use: Secondary | ICD-10-CM

## 2019-05-02 DIAGNOSIS — F331 Major depressive disorder, recurrent, moderate: Secondary | ICD-10-CM

## 2019-05-02 MED ORDER — AMITRIPTYLINE HCL 25 MG PO TABS: 12.5000 mg | ORAL_TABLET | Freq: Every evening | ORAL | 1 refills | Status: DC | PRN

## 2019-05-02 MED ORDER — ESCITALOPRAM OXALATE 10 MG PO TABS: 10.0000 mg | ORAL_TABLET | Freq: Every day | ORAL | 5 refills | Status: DC

## 2019-05-02 NOTE — Telephone Encounter (Signed)
LMOVM for Pt to return call.  

## 2019-05-02 NOTE — Patient Instructions (Signed)

## 2019-05-02 NOTE — Progress Notes (Signed)
Subjective:    Patient ID: Ann Anderson, female    DOB: [birth year], 43 y.o.   MRN: [id number]  Chief Complaint  Patient presents with  . weakness in legs, more left leg   Pt presents to the office today with bilateral leg weakness that is worse that started after her hospitalization  In 03/27/19 for stroke-like symptoms. She is followed by a neurologists for this leg weakness and had a EMG study done on her right leg, but was stopped prematurely because of pain. She has a follow up appt with Neurologists next month.   She states she feels like her weakness is worsen and has fallen several times while walking. She states she did not trip, but that her "legs just gave out".   She had a MRI of her head on 03/27/19 that was negative for stroke.  Anxiety Presents for follow-up visit. Symptoms include decreased concentration, depressed mood, excessive worry, irritability, nervous/anxious behavior and restlessness. Symptoms occur occasionally. The severity of symptoms is moderate.        Review of Systems  Constitutional: Positive for irritability.  Psychiatric/Behavioral: Positive for decreased concentration. The patient is nervous/anxious.        Objective:   Physical Exam Vitals signs reviewed.  Constitutional:      General: She is not in acute distress.    Appearance: She is well-developed.  HENT:     Head: Normocephalic and atraumatic.     Right Ear: Tympanic membrane normal.     Left Ear: Tympanic membrane normal.  Eyes:     Pupils: Pupils are equal, round, and reactive to light.  Neck:     Musculoskeletal: Normal range of motion and neck supple.     Thyroid: No thyromegaly.  Cardiovascular:     Rate and Rhythm: Normal rate and regular rhythm.     Heart sounds: Normal heart sounds. No murmur.  Pulmonary:     Effort: Pulmonary effort is normal. No respiratory distress.     Breath sounds: Normal breath sounds. No wheezing.  Abdominal:     General: Bowel sounds are  normal. There is no distension.     Palpations: Abdomen is soft.     Tenderness: There is no abdominal tenderness.  Musculoskeletal: Normal range of motion.        General: No tenderness.     Comments: Equal and 2+ strength noted bilaterally   Skin:    General: Skin is warm and dry.  Neurological:     Mental Status: She is alert and oriented to person, place, and time.     Cranial Nerves: No cranial nerve deficit.     Deep Tendon Reflexes: Reflexes are normal and symmetric.  Psychiatric:        Behavior: Behavior normal.        Thought Content: Thought content normal.        Judgment: Judgment normal.       BP 116/74   Pulse 77   Temp (!) 97.3 F (36.3 C) (Oral)   Ht 5' 4.5" (1.638 m)   Wt 198 lb (89.8 kg)   BMI 33.46 kg/m      Assessment & Plan:  Ann Anderson comes in today with chief complaint of weakness in legs, more left leg   Diagnosis and orders addressed:  1. Weakness of both lower extremities  2. Falling  3. Current smoker Smoking cessation discuussed  4. Tobacco abuse  5. MDD (major depressive disorder), recurrent episode, moderate (HCC) Will increase Lexapro  to 10 mg from 5 mg and decrease Amitriptyline to 12.5 mg Stress management discussed - escitalopram (LEXAPRO) 10 MG tablet; Take 1 tablet (10 mg total) by mouth daily.  Dispense: 30 tablet; Refill: 5 - amitriptyline (ELAVIL) 25 MG tablet; Take 0.5 tablets (12.5 mg total) by mouth at bedtime as needed for sleep.  Dispense: 45 tablet; Refill: 1   Labs reviewed, she states her Neurologists will drawing lab work [employer] note given Health Maintenance reviewed-Pt is self pay Diet and exercise encouraged  Follow up plan: Keep Neurologists appt    Jannifer Rodneyhristy Ranika Mcniel, FNP

## 2019-05-02 NOTE — Telephone Encounter (Signed)
-----   Message from Pieter Partridge, DO sent at 04/25/2019 12:31 PM EDT ----- Nerve conduction study was incomplete as she was unable to tolerate it.  We can check MRI of lumbar spine for further evaluation of chronic low back pain with bilateral leg weakness and numbness.

## 2019-05-03 NOTE — Telephone Encounter (Signed)
Patient left VM returning your call. Thanks!  °

## 2019-05-04 NOTE — Telephone Encounter (Signed)
Called and advised Pt. She is attempting to get coverage through [facility] to cover since her MCD is family coverage only. I will order at [facility] and advised Pt will send info through My Chart

## 2019-05-04 NOTE — Addendum Note (Signed)
Addended by: Clois Comber on: 05/04/2019 08:31 AM   Modules accepted: Orders

## 2019-05-10 ENCOUNTER — Ambulatory Visit (INDEPENDENT_AMBULATORY_CARE_PROVIDER_SITE_OTHER): Payer: Self-pay | Admitting: Family Medicine

## 2019-05-10 DIAGNOSIS — R197 Diarrhea, unspecified: Secondary | ICD-10-CM

## 2019-05-10 DIAGNOSIS — R112 Nausea with vomiting, unspecified: Secondary | ICD-10-CM

## 2019-05-10 NOTE — Progress Notes (Signed)
Telephone visit  Subjective: CC: vomiting PCP: Sharion Balloon, FNP PJA:SNKNL Ann Anderson is a 43 y.o. female calls for telephone consult today. Patient provides verbal consent for consult held via phone.  Location of patient: home Location of provider: Working remotely from home Others present for call: none  1. Vomiting Patient reports onset of nausea, vomiting and diarrhea yesterday.  She reports 2 episodes of nonbilious, nonbloody vomiting today.  She is had persistent generalized abdominal cramping.  Denies any hematochezia, melena, fevers.  No known sick contacts.  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  Her fianc was recently hospitalized and she did go to the hospital to see him but notes that she was not "in contact with anybody on the bad floor".  She did miss work and is asking for a work excuse.  She has Zofran at home but has not used this.   ROS: Per HPI  Allergies  Allergen Reactions  . Hydralazine Hives  . Nitrofurantoin Hives   Past Medical History:  Diagnosis Date  . Degenerative disc disease, lumbar   . Hyperlipidemia   . Hypertension   . Neuropathy     Current Outpatient Medications:  .  amitriptyline (ELAVIL) 25 MG tablet, Take 0.5 tablets (12.5 mg total) by mouth at bedtime as needed for sleep., Disp: 45 tablet, Rfl: 1 .  atorvastatin (LIPITOR) 20 MG tablet, Take 1 tablet (20 mg total) by mouth daily., Disp: 90 tablet, Rfl: 3 .  clopidogrel (PLAVIX) 75 MG tablet, Take 1 tablet (75 mg total) by mouth daily., Disp: 90 tablet, Rfl: 1 .  cyclobenzaprine (FLEXERIL) 10 MG tablet, Take 1 tablet (10 mg total) by mouth daily., Disp: 60 tablet, Rfl: 2 .  escitalopram (LEXAPRO) 10 MG tablet, Take 1 tablet (10 mg total) by mouth daily., Disp: 30 tablet, Rfl: 5 .  hydrochlorothiazide (HYDRODIURIL) 12.5 MG tablet, Take 1 tablet (12.5 mg total) by mouth daily., Disp: 90 tablet, Rfl: 3 .  ibuprofen (ADVIL,MOTRIN) 800 MG tablet, Take by mouth as needed. ,  Disp: , Rfl:  .  vitamin E 100 UNIT capsule, Take 100 Units by mouth daily., Disp: , Rfl:   Assessment/ Plan: 43 y.o. female   1. Nausea vomiting and diarrhea I have recommended testing at the [facility], as patient is uninsured and cannot afford testing at the [place] location.  I informed her that there is testing available from 10-2 on Tuesdays and Thursdays but at highly recommended that she get there early.  We discussed that we cannot rule out coronavirus at this time given constellation of symptoms.  I have recommended that she isolate until she has a negative result or for at least 10 days and be asymptomatic without intervention for 3 consecutive days before returning to work.  A work note has been provided and placed in her chart.  She is aware of reasons for emergent evaluation emergency department.  She will follow-up PRN   Start time: 1:33pm End time: 1:41pm  Total time spent on patient care (including telephone call/ virtual visit): 16 minutes  [place], [place] [phone]

## 2019-05-10 NOTE — Patient Instructions (Signed)
Person Under Monitoring Name: Ann Anderson  Location: [address] [place] [address]   Infection Prevention Recommendations for Individuals Confirmed to have, or Being Evaluated for, 2019 Novel Coronavirus (COVID-19) Infection Who Receive Care at Home  Individuals who are confirmed to have, or are being evaluated for, COVID-19 should follow the prevention steps below until a healthcare provider or local or state health department says they can return to normal activities.  Stay home except to get medical care You should restrict activities outside your home, except for getting medical care. Do not go to work, school, or public areas, and do not use public transportation or taxis.  Call ahead before visiting your doctor Before your medical appointment, call the healthcare provider and tell them that you have, or are being evaluated for, COVID-19 infection. This will help the healthcare provider's office take steps to keep other people from getting infected. Ask your healthcare provider to call the local or state health department.  Monitor your symptoms Seek prompt medical attention if your illness is worsening (e.g., difficulty breathing). Before going to your medical appointment, call the healthcare provider and tell them that you have, or are being evaluated for, COVID-19 infection. Ask your healthcare provider to call the local or state health department.  Wear a facemask You should wear a facemask that covers your nose and mouth when you are in the same room with other people and when you visit a healthcare provider. People who live with or visit you should also wear a facemask while they are in the same room with you.  Separate yourself from other people in your home As much as possible, you should stay in a different room from other people in your home. Also, you should use a separate bathroom, if available.  Avoid sharing household items You should not share dishes,  drinking glasses, cups, eating utensils, towels, bedding, or other items with other people in your home. After using these items, you should wash them thoroughly with soap and water.  Cover your coughs and sneezes Cover your mouth and nose with a tissue when you cough or sneeze, or you can cough or sneeze into your sleeve. Throw used tissues in a lined trash can, and immediately wash your hands with soap and water for at least 20 seconds or use an alcohol-based hand rub.  Wash your [facility] your hands often and thoroughly with soap and water for at least 20 seconds. You can use an alcohol-based hand sanitizer if soap and water are not available and if your hands are not visibly dirty. Avoid touching your eyes, nose, and mouth with unwashed hands.   Prevention Steps for Caregivers and Household Members of Individuals Confirmed to have, or Being Evaluated for, COVID-19 Infection Being Cared for in the Home  If you live with, or provide care at home for, a person confirmed to have, or being evaluated for, COVID-19 infection please follow these guidelines to prevent infection:  Follow healthcare provider's instructions Make sure that you understand and can help the patient follow any healthcare provider instructions for all care.  Provide for the patient's basic needs You should help the patient with basic needs in the home and provide support for getting groceries, prescriptions, and other personal needs.  Monitor the patient's symptoms If they are getting sicker, call his or her medical provider and tell them that the patient has, or is being evaluated for, COVID-19 infection. This will help the healthcare provider's office take  steps to keep other people from getting infected. Ask the healthcare provider to call the local or state health department.  Limit the number of people who have contact with the patient  If possible, have only one caregiver for the patient.  Other  household members should stay in another home or place of residence. If this is not possible, they should stay  in another room, or be separated from the patient as much as possible. Use a separate bathroom, if available.  Restrict visitors who do not have an essential need to be in the home.  Keep older adults, very young children, and other sick people away from the patient Keep older adults, very young children, and those who have compromised immune systems or chronic health conditions away from the patient. This includes people with chronic heart, lung, or kidney conditions, diabetes, and cancer.  Ensure good ventilation Make sure that shared spaces in the home have good air flow, such as from an air conditioner or an opened window, weather permitting.  Wash your hands often  Wash your hands often and thoroughly with soap and water for at least 20 seconds. You can use an alcohol based hand sanitizer if soap and water are not available and if your hands are not visibly dirty.  Avoid touching your eyes, nose, and mouth with unwashed hands.  Use disposable paper towels to dry your hands. If not available, use dedicated cloth towels and replace them when they become wet.  Wear a facemask and gloves  Wear a disposable facemask at all times in the room and gloves when you touch or have contact with the patient's blood, body fluids, and/or secretions or excretions, such as sweat, saliva, sputum, nasal mucus, vomit, urine, or feces.  Ensure the mask fits over your nose and mouth tightly, and do not touch it during use.  Throw out disposable facemasks and gloves after using them. Do not reuse.  Wash your hands immediately after removing your facemask and gloves.  If your personal clothing becomes contaminated, carefully remove clothing and launder. Wash your hands after handling contaminated clothing.  Place all used disposable facemasks, gloves, and other waste in a lined container before  disposing them with other household waste.  Remove gloves and wash your hands immediately after handling these items.  Do not share dishes, glasses, or other household items with the patient  Avoid sharing household items. You should not share dishes, drinking glasses, cups, eating utensils, towels, bedding, or other items with a patient who is confirmed to have, or being evaluated for, COVID-19 infection.  After the person uses these items, you should wash them thoroughly with soap and water.  Wash laundry thoroughly  Immediately remove and wash clothes or bedding that have blood, body fluids, and/or secretions or excretions, such as sweat, saliva, sputum, nasal mucus, vomit, urine, or feces, on them.  Wear gloves when handling laundry from the patient.  Read and follow directions on labels of laundry or clothing items and detergent. In general, wash and dry with the warmest temperatures recommended on the label.  Clean all areas the individual has used often  Clean all touchable surfaces, such as counters, tabletops, doorknobs, bathroom fixtures, toilets, phones, keyboards, tablets, and bedside tables, every day. Also, clean any surfaces that may have blood, body fluids, and/or secretions or excretions on them.  Wear gloves when cleaning surfaces the patient has come in contact with.  Use a diluted bleach solution (e.g., dilute bleach with 1 part bleach  and 10 parts water) or a household disinfectant with a label that says EPA-registered for coronaviruses. To make a bleach solution at home, add 1 tablespoon of bleach to 1 quart (4 cups) of water. For a larger supply, add  cup of bleach to 1 gallon (16 cups) of water.  Read labels of cleaning products and follow recommendations provided on product labels. Labels contain instructions for safe and effective use of the cleaning product including precautions you should take when applying the product, such as wearing gloves or eye protection  and making sure you have good ventilation during use of the product.  Remove gloves and wash hands immediately after cleaning.  Monitor yourself for signs and symptoms of illness Caregivers and household members are considered close contacts, should monitor their health, and will be asked to limit movement outside of the home to the extent possible. Follow the monitoring steps for close contacts listed on the symptom monitoring form.   ? If you have additional questions, contact your local health department or call the epidemiologist on call at [phone] (available 24/7). ? This guidance is subject to change. For the most up-to-date guidance from [facility], please refer to their website: [email or web address]

## 2019-05-15 ENCOUNTER — Ambulatory Visit (HOSPITAL_COMMUNITY)
Admission: RE | Admit: 2019-05-15 | Discharge: 2019-05-15 | Disposition: A | Discharge status: Home / Self Care | Payer: Self-pay | Source: Ambulatory Visit | Attending: Neurology | Admitting: Neurology

## 2019-05-15 ENCOUNTER — Other Ambulatory Visit: Payer: Self-pay

## 2019-05-15 DIAGNOSIS — R200 Anesthesia of skin: Secondary | ICD-10-CM | POA: Insufficient documentation

## 2019-05-22 ENCOUNTER — Encounter: Payer: Self-pay | Admitting: Family Medicine

## 2019-05-23 ENCOUNTER — Other Ambulatory Visit: Payer: Self-pay | Admitting: Family

## 2019-06-15 NOTE — Progress Notes (Deleted)
NEUROLOGY FOLLOW UP OFFICE NOTE  Ann DusterSusan Anderson [id number]  HISTORY OF PRESENT ILLNESS: Ann DusterSusan Anderson is a 43 year old Caucasian woman with hypertension and hyperlipidemia and anxiety who follows up for recurrent episodes of left sided weakness and numbness.  UPDATE: For evaluation of neuropathy in lower extremities, a NCV-EMG was ordered but she could not tolerate it.    HISTORY: She started having habitual episodes of left sided numbness and weakness in 2015.  She has had a total of 5 episodes.  MRIs have always been negative for stroke.  In 2018, she had associated cognitive and speech difficulty.  She was discharged on Plavix.  Over the past year, she was started on Lipitor 20mg  and HCTZ.  She was admitted to [facility] last week for another habitual episode presenting as acute onset left sided numbness of face, arm and leg as well as left arm and leg weakness. She also experienced anxiety attack with chest pain. When symptoms did not resolve after 2 days, she presented to the hospital for further evaluation.  CT of head showed on acute abnormality.  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  Echocardiogram showed EF 63% with no significant valvular abnormalities.  EKG demonstrated normal sinus rhythm.  Cardiac workup otherwise negative as well.  UA and urine drug screen were negative.  Hgb A1c was 5.5.  She was continued on Plavix and Lipitor as well as HCTZ.  She reports slight left occipital headache associated with this last episode.  Otherwise, she denies any history of headaches.  Episodes usually last several hours.  Following this past episode, she still reports mild residual left upper and lower  extremity weakness.  LDL in 2019 was 39.1  She also reports history of lower extremity numbness with burning and weakness causing several falls.  She has chronic back pain with lumbar degenerative disease (as noted on prior lumbar X-rays) and sciatica.  Symptoms fluctuate and sometimes requires use of a cane.  She has been followed by neurologists since 2017.  In 2017, B12 was 442, TSH was 2.55 and Hgb A1c was 5.5.  She was last seen by neurology in [place] in August 2019.  Blood work revealed B12 404, TSH 3.32, ANA negative, RF <14, Sed Rate 16.  NCV-EMG was recommended.  PAST MEDICAL HISTORY: Past Medical History:  Diagnosis Date   Degenerative disc disease, lumbar    Hyperlipidemia    Hypertension    Neuropathy     MEDICATIONS: Current Outpatient Medications on File Prior to Visit  Medication Sig Dispense Refill   amitriptyline (ELAVIL) 25 MG tablet Take 0.5 tablets (12.5 mg total) by mouth at bedtime as needed for sleep. 45 tablet 1   atorvastatin (LIPITOR) 20 MG tablet Take 1 tablet (20 mg total) by mouth daily. 90 tablet 3   clopidogrel (PLAVIX) 75 MG tablet Take 1 tablet (75 mg total) by mouth daily. 90 tablet 1   cyclobenzaprine (FLEXERIL) 10 MG tablet Take 1 tablet (10 mg total) by mouth daily. 60 tablet 2   escitalopram (LEXAPRO) 10 MG tablet Take 1 tablet (10 mg total) by mouth daily. 30 tablet 5   hydrochlorothiazide (HYDRODIURIL) 12.5 MG tablet Take 1 tablet (12.5 mg total) by mouth daily. 90 tablet 3  ibuprofen (ADVIL,MOTRIN) 800 MG tablet Take by mouth as needed.      vitamin E 100 UNIT capsule Take 100 Units by mouth daily.     No current facility-administered medications on file prior to visit.     ALLERGIES: Allergies  Allergen Reactions   Hydralazine Hives   Nitrofurantoin Hives    FAMILY HISTORY: Family History  Problem Relation Age of Onset   Cancer Mother        breast   Heart disease Father    Dementia Father    ***.  SOCIAL  HISTORY: Social History   Socioeconomic History   Marital status: Divorced    Spouse name: Not on file   Number of children: 1   Years of education: Not on file   Highest education level: Not on file  Occupational History   Occupation: Designer, [employer]  Social Needs   Financial resource strain: Not on file   Food insecurity    Worry: Not on file    Inability: Not on file   Transportation needs    Medical: Not on file    Non-medical: Not on file  Tobacco Use   Smoking status: Current Every Day Smoker    Packs/day: 1.00    Years: 6.00    Pack years: 6.00    Types: Cigarettes   Smokeless tobacco: Never Used   Tobacco comment: Restarted 2 years ago and trying to cut back.   Substance and Sexual Activity   Alcohol use: Yes    Comment: Social   Drug use: Never   Sexual activity: Not Currently    Birth control/protection: Surgical  Lifestyle   Physical activity    Days per week: Not on file    Minutes per session: Not on file   Stress: Not on file  Relationships   Social connections    Talks on phone: Not on file    Gets together: Not on file    Attends religious service: Not on file    Active member of club or organization: Not on file    Attends meetings of clubs or organizations: Not on file    Relationship status: Not on file   Intimate partner violence    Fear of current or ex partner: Not on file    Emotionally abused: Not on file    Physically abused: Not on file    Forced sexual activity: Not on file  Other Topics Concern   Not on file  Social History Narrative   Right handed   Lives in a single story home with daughter and cousins   Works at [employer]    REVIEW OF SYSTEMS: Constitutional: No fevers, chills, or sweats, no generalized fatigue, change in appetite Eyes: No visual changes, double vision, eye pain Ear, nose and throat: No hearing loss, ear pain, nasal congestion, sore throat Cardiovascular: No chest pain,  palpitations Respiratory:  No shortness of breath at rest or with exertion, wheezes GastrointestinaI: No nausea, vomiting, diarrhea, abdominal pain, fecal incontinence Genitourinary:  No dysuria, urinary retention or frequency Musculoskeletal:  No neck pain, back pain Integumentary: No rash, pruritus, skin lesions Neurological: as above Psychiatric: No depression, insomnia, anxiety Endocrine: No palpitations, fatigue, diaphoresis, mood swings, change in appetite, change in weight, increased thirst Hematologic/Lymphatic:  No purpura, petechiae. Allergic/Immunologic: no itchy/runny eyes, nasal congestion, recent allergic reactions, rashes  PHYSICAL EXAM: *** General: No acute distress.  Patient appears ***-groomed.   Head:  Normocephalic/atraumatic Eyes:  Fundi examined but not visualized Neck:  supple, no paraspinal tenderness, full range of motion Heart:  Regular rate and rhythm Lungs:  Clear to auscultation bilaterally Back: No paraspinal tenderness Neurological Exam: alert and oriented to person, place, and time. Attention span and concentration intact, recent and remote memory intact, fund of knowledge intact.  Speech fluent and not dysarthric, language intact.  CN II-XII intact. Bulk and tone normal, muscle strength 5/5 throughout.  Sensation to light touch, temperature and vibration intact.  Deep tendon reflexes 2+ throughout, toes downgoing.  Finger to nose and heel to shin testing intact.  Gait normal, Romberg negative.  IMPRESSION: ***  PLAN: ***  Metta Clines, DO  CC: ***

## 2019-06-16 ENCOUNTER — Ambulatory Visit: Payer: Medicaid Other | Admitting: Neurology

## 2019-07-05 NOTE — Progress Notes (Unsigned)
Virtual Visit via Video Note The purpose of this virtual visit is to provide medical care while limiting exposure to the novel coronavirus.    Consent was obtained for video visit:  Yes.   Answered questions that patient had about telehealth interaction:  Yes.   I discussed the limitations, risks, security and privacy concerns of performing an evaluation and management service by telemedicine. I also discussed with the patient that there may be a patient responsible charge related to this service. The patient expressed understanding and agreed to proceed.  Pt location: Home Physician Location: Home Name of referring provider:  Junie SpencerHawks, Christy A, FNP I connected with Ann DusterSusan Anderson at patients initiation/request on 07/06/2019 at 10:10 AM EDT by video enabled telemedicine application and verified that I am speaking with the correct person using two identifiers. Pt MRN:  [id number] Pt DOB:  12/01/1975 Video Participants:  Ann DusterSusan Anderson   History of Present Illness:  Ann DusterSusan Anderson is a 43 year old Caucasian woman with hypertension and hyperlipidemia and anxiety who follows up for stroke-like episodes and lower extremity numbness and tingling.  UPDATE: To evaluate numbness, paresthesias and burning in the lower extremities, she was to undergo NCV-EMG but could not be performed as she was unable to tolerate the pain.  She did have an MRI of the lumbar spine without contrast on 05/15/2019 which showed minimal multilevel disc desiccation and disc bulge at L4-L5 but no significant central or neural foraminal stenosis.  HISTORY: She started having habitual episodes of left sided numbness and weakness in 2015.  She has had a total of 5 episodes.  MRIs have always been negative for stroke.  In 2018, she had associated cognitive and speech difficulty.  She was discharged on Plavix.  Over the past year, she was started on Lipitor 20mg  and HCTZ.  She was admitted [facility] in early June 2020 for  another habitual episode presenting as acute onset left sided numbness of face, arm and leg as well as left arm and leg weakness. She also experienced anxiety attack with chest pain. When symptoms did not resolve after 2 days, she presented to the hospital for further evaluation.  CT of head showed on acute abnormality.  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  Echocardiogram showed EF 63% with no significant valvular abnormalities.  EKG demonstrated normal sinus rhythm.  Cardiac workup otherwise negative as well.  UA and urine drug screen were negative.  Hgb A1c was 5.5.  She was continued on Plavix and Lipitor as well as HCTZ.  She reports slight left occipital headache associated with this last episode.  Otherwise, she denies any history of headaches.  Episodes usually last several hours.  Following this past episode, she still reports mild residual left upper and lower extremity weakness.  LDL in 2019 was 39.1  She also reports history of lower extremity numbness with burning and weakness causing several falls.  She has chronic back pain with lumbar degenerative disease and sciatica.  Symptoms fluctuate and sometimes requires use of a cane.  She has been followed by neurologists since 2017.  In 2017, B12 was 442, TSH was 2.55 and Hgb A1c was 5.5.  She was last seen by neurology in [place] in August 2019.  Blood work revealed B12 404, TSH 3.32, ANA negative, RF <14, Sed Rate 16.    Past Medical History: Past Medical History:  Diagnosis Date  . Degenerative disc disease, lumbar   . Hyperlipidemia   . Hypertension   . Neuropathy     Medications: Outpatient Encounter Medications as of 07/06/2019  Medication Sig   . amitriptyline (ELAVIL) 25 MG tablet Take 0.5 tablets (12.5 mg total) by mouth at bedtime as needed for sleep.  Marland Kitchen atorvastatin (LIPITOR) 20 MG tablet Take 1 tablet (20 mg total) by mouth daily.  . clopidogrel (PLAVIX) 75 MG tablet Take 1 tablet (75 mg total) by mouth daily.  . cyclobenzaprine (FLEXERIL) 10 MG tablet Take 1 tablet (10 mg total) by mouth daily.  Marland Kitchen escitalopram (LEXAPRO) 10 MG tablet Take 1 tablet (10 mg total) by mouth daily.  . hydrochlorothiazide (HYDRODIURIL) 12.5 MG tablet Take 1 tablet (12.5 mg total) by mouth daily.  Marland Kitchen ibuprofen (ADVIL,MOTRIN) 800 MG tablet Take by mouth as needed.   . vitamin E 100 UNIT capsule Take 100 Units by mouth daily.   No facility-administered encounter medications on file as of 07/06/2019.     Allergies: Allergies  Allergen Reactions  . Hydralazine Hives  . Nitrofurantoin Hives    Family History: Family History  Problem Relation Age of Onset  . Cancer Mother        breast  . Heart disease Father   . Dementia Father     Social History: Social History   Socioeconomic History  . Marital status: Divorced    Spouse name: Not on file  . Number of children: 1  . Years of education: Not on file  . Highest education level: Not on file  Occupational History  . Occupation: Public house manager  Social Needs  . Financial resource strain: Not on file  . Food insecurity    Worry: Not on file    Inability: Not on file  . Transportation needs    Medical: Not on file    Non-medical: Not on file  Tobacco Use  . Smoking status: Current Every Day Smoker    Packs/day: 1.00    Years: 6.00    Pack years: 6.00    Types: Cigarettes  . Smokeless tobacco: Never Used  . Tobacco comment: Restarted 2 years ago and trying to cut back.   Substance and Sexual Activity  . Alcohol use: Yes    Comment: Social  . Drug use: Never  . Sexual activity: Not Currently    Birth control/protection: Surgical  Lifestyle  . Physical activity    Days per week:  Not on file    Minutes per session: Not on file  . Stress: Not on file  Relationships  . Social Herbalist on phone: Not on file    Gets together: Not on file    Attends religious service: Not on file    Active member of club or organization: Not on file    Attends meetings of clubs or organizations: Not on file    Relationship status: Not on file  . Intimate partner violence    Fear of current or ex partner: Not on file    Emotionally abused: Not on file    Physically abused: Not on file    Forced sexual activity: Not on file  Other Topics Concern  . Not on file  Social History Narrative   Right handed   Lives in a single story home with daughter and  cousins   Works at [employer]    Observations/Objective:   *** No acute distress.  Alert and oriented.  Speech fluent and not dysarthric.  Language intact.  Eyes orthophoric on primary gaze.  Face symmetric.  Assessment and Plan:   1.  Recurrent episodes of left sided numbness and weakness.  Given the habitual semiology of symptoms over the past 5 years, as well as no focal arterial stenosis to explain such symptoms, suspicion for TIAs is low.  Consider hemiplegic migraine, which would be diagnosis of exclusion.  She reports no history of headaches.  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 2.  Numbness, paresthesias, burning in lower extremities.  Unfortunately, she was unable to tolerate NCV.  However, blood work for common causes of neuropathy have been negative and MRI of lumbar spine show no explainable cause.  ***  Follow Up Instructions:    -I discussed the assessment and treatment plan with the patient. The patient was provided an opportunity to ask questions and all were answered. The patient agreed with the plan and demonstrated an understanding of the instructions.   The patient was advised  to call back or seek an in-person evaluation if the symptoms worsen or if the condition fails to improve as anticipated.    Total Time spent in visit with the patient was:  ***, of which more than 50% of the time was spent in counseling and/or coordinating care on ***.   Pt understands and agrees with the plan of care outlined.     Cira Servant, DO

## 2019-07-06 ENCOUNTER — Telehealth: Payer: Self-pay | Admitting: Neurology

## 2019-07-06 ENCOUNTER — Other Ambulatory Visit: Payer: Self-pay

## 2019-07-10 NOTE — Progress Notes (Addendum)
Virtual Visit via Video Note The purpose of this virtual visit is to provide medical care while limiting exposure to the novel coronavirus.    Consent was obtained for video visit:  Yes Answered questions that patient had about telehealth interaction:  Yes I discussed the limitations, risks, security and privacy concerns of performing an evaluation and management service by telemedicine. I also discussed with the patient that there may be a patient responsible charge related to this service. The patient expressed understanding and agreed to proceed.  Pt location: Home Physician Location: Office Name of referring provider:  Junie Spencer, FNP I connected with Marcelino Duster at patients initiation/request on 07/12/2019 at 10:30 AM EDT by video enabled telemedicine application and verified that I am speaking with the correct person using two identifiers. Pt MRN:  [id number] Pt DOB:  1976-06-13 Video Participants:  Marcelino Duster   History of Present Illness:  Ann Anderson is a 43 year old Caucasian woman with hypertension and hyperlipidemiaand anxietywho follows up for stroke-like episodes and lower extremity numbness and tingling.  UPDATE: To evaluate numbness, paresthesias and burning in the lower extremities, she was to undergo NCV-EMG but could not be performed as she was unable to tolerate the pain.  She did have an MRI of the lumbar spine without contrast on 05/15/2019 which showed minimal multilevel disc desiccation and disc bulge at L4-L5 but no significant central or neural foraminal stenosis.  Since hospitalization in June, she reports increased migraines.  They are severe squeezing pain in the temples, back of head and neck with associated nausea.  They typically last a couple of hours to all day.  They occur 2 (sometimes 3) days a week.  She takes Tylenol 500mg .  Advil upsets her stomach.  Other medications:  Flexeril, Lexapro  HISTORY: She started having habitual episodes of  left sided numbness and weakness in 2015. She has had a total of 5 episodes. MRIs have always been negative for stroke. In 2018, she had associated cognitive and speech difficulty. She was discharged on Plavix. Over the past year, she was started on Lipitor 20mg  and HCTZ.  She was admitted to [facility] in early June 2020 for another habitual episode presenting as acute onset left sided numbness of face, arm and leg as well as left arm and leg weakness. She also experienced anxiety attack with chest pain. When symptoms did not resolve after 2 days, she presented to the hospital for further evaluation. CT of head showed on acute abnormality. 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 Echocardiogram showed EF 63% with no significant valvular abnormalities. EKG demonstrated normal sinus rhythm. Cardiac workup otherwise negative as well. UA and urine drug screen were negative. Hgb A1c was 5.5. She was continued on Plavix and Lipitor as well as HCTZ. She reports slight left occipital headache associated with this last episode. Otherwise, she denies any history of headaches. Episodes usually last several hours. Following this past episode, she still reports mild residual left upper and lower extremity weakness. LDL in 2019 was 39.1  She also reports history of lower extremity numbness with burning and weakness causing several falls. She has chronic back pain with lumbar degenerative disease and sciatica. Symptoms fluctuate and sometimes requires use of a cane.  She has been followed by neurologists since 2017. In 2017, B12 was 442, TSH was 2.55 and Hgb A1c was 5.5. She was last seen by neurology in  [place] in August 2019. Blood work revealed B12 404, TSH 3.32, ANA negative, RF <14, Sed Rate 16.   Past Medical History: Past Medical History:  Diagnosis Date  . Degenerative disc disease, lumbar   . Hyperlipidemia   . Hypertension   . Neuropathy     Medications: Outpatient Encounter Medications as of 07/12/2019  Medication Sig  . amitriptyline (ELAVIL) 25 MG tablet Take 0.5 tablets (12.5 mg total) by mouth at bedtime as needed for sleep.  Marland Kitchen atorvastatin (LIPITOR) 20 MG tablet Take 1 tablet (20 mg total) by mouth daily.  . clopidogrel (PLAVIX) 75 MG tablet Take 1 tablet (75 mg total) by mouth daily.  . cyclobenzaprine (FLEXERIL) 10 MG tablet Take 1 tablet (10 mg total) by mouth daily.  Marland Kitchen escitalopram (LEXAPRO) 10 MG tablet Take 1 tablet (10 mg total) by mouth daily.  . hydrochlorothiazide (HYDRODIURIL) 12.5 MG tablet Take 1 tablet (12.5 mg total) by mouth daily.  Marland Kitchen ibuprofen (ADVIL,MOTRIN) 800 MG tablet Take by mouth as needed.   . vitamin E 100 UNIT capsule Take 100 Units by mouth daily.   No facility-administered encounter medications on file as of 07/12/2019.     Allergies: Allergies  Allergen Reactions  . Hydralazine Hives  . Nitrofurantoin Hives    Family History: Family History  Problem Relation Age of Onset  . Cancer Mother        breast  . Heart disease Father   . Dementia Father     Social History: Social History   Socioeconomic History  . Marital status: Divorced    Spouse name: Not on file  . Number of children: 1  . Years of education: Not on file  . Highest education level: Not on file  Occupational History  . Occupation: Public house manager  Social Needs  . Financial resource strain: Not on file  . Food insecurity    Worry: Not on file    Inability: Not on file  . Transportation needs    Medical: Not on file    Non-medical: Not on file  Tobacco Use  . Smoking status: Current Every Day Smoker    Packs/day: 1.00    Years: 6.00    Pack years:  6.00    Types: Cigarettes  . Smokeless tobacco: Never Used  . Tobacco comment: Restarted 2 years ago and trying to cut back.   Substance and Sexual Activity  . Alcohol use: Yes    Comment: Social  . Drug use: Never  . Sexual activity: Not Currently    Birth control/protection: Surgical  Lifestyle  . Physical activity    Days per week: Not on file    Minutes per session: Not on file  . Stress: Not on file  Relationships  . Social Herbalist on phone: Not on file    Gets together: Not on file    Attends religious service: Not on file    Active member of club or organization: Not on file    Attends meetings of clubs or organizations: Not on file    Relationship status: Not on file  . Intimate partner violence    Fear of current or ex partner: Not on file    Emotionally abused: Not on file    Physically abused: Not on file    Forced sexual activity:  Not on file  Other Topics Concern  . Not on file  Social History Narrative   Right handed   Lives in a single story home with daughter and cousins   Works at [employer]    Observations/Objective:   Height 5' 4.5" (1.638 m), weight 198 lb (89.8 kg). No acute distress.  Alert and oriented.  Speech fluent and not dysarthric.  Language intact.  Eyes orthophoric on primary gaze.  Face symmetric.  Assessment and Plan:   1.  Migraine without aura, without status migrainosus, not intractable 2.  Recurrent episodes of left sided numbness and weakness. Given the habitual semiology of symptoms over the past 5 years, as well as no focal arterial stenosis to explain such symptoms, suspicion for TIAs is low.  Consider hemiplegic migraine, which would be diagnosis of exclusion.  She reports no history of headaches.  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 3.  Neuropathy  1.  Start topiramate 25mg  at bedtime for 1  week, then 50mg  at bedtime.  We can increase dose to 75mg  at bedtime in 5 weeks if needed.  Side effects discussed, such as paresthesias and risk of kidney stones and glaucoma. 2.  Limit use of pain relievers to no more than 2 days out of week to prevent risk of rebound or medication-overuse headache. 3.  Will check SPEP/IFE to evaluate for other cause of neuropathy 4.  Follow up in 4 months.  Follow Up Instructions:    -I discussed the assessment and treatment plan with the patient. The patient was provided an opportunity to ask questions and all were answered. The patient agreed with the plan and demonstrated an understanding of the instructions.   The patient was advised to call back or seek an in-person evaluation if the symptoms worsen or if the condition fails to improve as anticipated.   Cira ServantAdam Robert Brianda Beitler, DO

## 2019-07-12 ENCOUNTER — Telehealth (INDEPENDENT_AMBULATORY_CARE_PROVIDER_SITE_OTHER): Payer: Self-pay | Admitting: Neurology

## 2019-07-12 ENCOUNTER — Encounter: Payer: Self-pay | Admitting: Neurology

## 2019-07-12 ENCOUNTER — Other Ambulatory Visit: Payer: Self-pay

## 2019-07-12 VITALS — Ht 64.5 in | Wt 198.0 lb

## 2019-07-12 DIAGNOSIS — G43009 Migraine without aura, not intractable, without status migrainosus: Secondary | ICD-10-CM

## 2019-07-12 DIAGNOSIS — G43409 Hemiplegic migraine, not intractable, without status migrainosus: Secondary | ICD-10-CM

## 2019-07-12 DIAGNOSIS — R2990 Unspecified symptoms and signs involving the nervous system: Secondary | ICD-10-CM

## 2019-07-12 DIAGNOSIS — R202 Paresthesia of skin: Secondary | ICD-10-CM

## 2019-07-12 DIAGNOSIS — I10 Essential (primary) hypertension: Secondary | ICD-10-CM

## 2019-07-12 DIAGNOSIS — R200 Anesthesia of skin: Secondary | ICD-10-CM

## 2019-07-12 MED ORDER — TOPIRAMATE 50 MG PO TABS: ORAL_TABLET | ORAL | 0 refills | Status: DC

## 2019-07-12 NOTE — Addendum Note (Signed)
Addended by: Jesse Fall on: 07/12/2019 11:33 AM   Modules accepted: Orders

## 2019-07-13 ENCOUNTER — Telehealth: Payer: Self-pay | Admitting: *Deleted

## 2019-07-13 NOTE — Telephone Encounter (Signed)
Left message with patient that she can come in to the office to get her blood drawn btw 8-12 or 130-330. Will check in at our front desk. Orders entered yesterday and patient knew I was calling to verify they could be drawn here - spoke with penny in lab and she can see orders.

## 2019-07-19 ENCOUNTER — Other Ambulatory Visit: Payer: No Typology Code available for payment source

## 2019-07-20 ENCOUNTER — Other Ambulatory Visit: Payer: No Typology Code available for payment source

## 2019-07-20 ENCOUNTER — Other Ambulatory Visit: Payer: Self-pay

## 2019-07-20 DIAGNOSIS — R202 Paresthesia of skin: Secondary | ICD-10-CM

## 2019-07-20 DIAGNOSIS — R200 Anesthesia of skin: Secondary | ICD-10-CM

## 2019-07-20 DIAGNOSIS — G43409 Hemiplegic migraine, not intractable, without status migrainosus: Secondary | ICD-10-CM

## 2019-07-20 DIAGNOSIS — I10 Essential (primary) hypertension: Secondary | ICD-10-CM

## 2019-07-20 DIAGNOSIS — R2990 Unspecified symptoms and signs involving the nervous system: Secondary | ICD-10-CM

## 2019-07-20 DIAGNOSIS — G43009 Migraine without aura, not intractable, without status migrainosus: Secondary | ICD-10-CM

## 2019-07-21 LAB — IMMUNOFIXATION ELECTROPHORESIS
IgG (Immunoglobin G), Serum: 849 mg/dL (ref 600–1640)
IgM, Serum: 104 mg/dL (ref 50–300)
Immunofix Electr Int: NOT DETECTED
Immunoglobulin A: 186 mg/dL (ref 47–310)

## 2019-07-24 ENCOUNTER — Telehealth: Payer: Self-pay | Admitting: *Deleted

## 2019-07-24 LAB — PROTEIN ELECTROPHORESIS, SERUM
Albumin ELP: 3.7 g/dL — ABNORMAL LOW (ref 3.8–4.8)
Alpha 1: 0.3 g/dL (ref 0.2–0.3)
Alpha 2: 0.8 g/dL (ref 0.5–0.9)
Beta 2: 0.4 g/dL (ref 0.2–0.5)
Beta Globulin: 0.5 g/dL (ref 0.4–0.6)
Gamma Globulin: 0.8 g/dL (ref 0.8–1.7)
Total Protein: 6.4 g/dL (ref 6.1–8.1)

## 2019-07-24 NOTE — Telephone Encounter (Addendum)
Per chart was allowed to leave message on answering machine - left message that labs were normal. Left our phone number if patient had any further questions.     ----- Message from Pieter Partridge, DO sent at 07/24/2019  6:57 AM EDT ----- Lab is normal

## 2019-08-02 ENCOUNTER — Telehealth: Payer: Self-pay | Admitting: Neurology

## 2019-08-02 NOTE — Telephone Encounter (Signed)
Patient is calling in about having some questions regarding the lab work results. Thanks!

## 2019-08-03 NOTE — Telephone Encounter (Signed)
Protein Electrophoresis, (serum) Order: [id number] Status:  Final result Visible to patient:  Yes (MyChart) Next appt:  11/14/2019 at 10:50 AM in Neurology (Adam Melvern Sample, DO) Dx:  Migraine without aura and without sta...  Ref Range & Units 2wk ago 91mo ago  Total Protein 6.1 - 8.1 g/dL 6.4  6.8 R   Albumin ELP 3.8 - 4.8 g/dL 3.7Low     Alpha 1 0.2 - 0.3 g/dL 0.3    Alpha 2 0.5 - 0.9 g/dL 0.8    Beta Globulin 0.4 - 0.6 g/dL 0.5    Beta 2 0.2 - 0.5 g/dL 0.4    Gamma Globulin 0.8 - 1.7 g/dL 0.8    SPE Interp.     Comment: .  Evaluation reveals an isolated decrease in albumin.  This pattern is suggestive of decreased protein  synthesis or protein loss. Consider ordering pre-  albumin quantitation.  .        Pt reviewed labs on my chart and is wondering if this is something that needs to be addressed

## 2019-08-03 NOTE — Telephone Encounter (Signed)
No.  It is not significant.

## 2019-08-04 NOTE — Telephone Encounter (Signed)
Called patient no answer left message with provider response on voice mail 

## 2019-08-04 NOTE — Telephone Encounter (Signed)
Pt return called she was given provider response. Aware and understands

## 2019-08-28 ENCOUNTER — Telehealth: Payer: Self-pay | Admitting: Neurology

## 2019-08-28 MED ORDER — TOPIRAMATE 50 MG PO TABS: ORAL_TABLET | ORAL | 1 refills | Status: DC

## 2019-08-28 NOTE — Telephone Encounter (Signed)
Assessment and Plan:   1.  Migraine without aura, without status migrainosus, not intractable 2.  Recurrent episodes of left sided numbness and weakness. Given the habitual semiology of symptoms over the past 5 years, as well as no focal arterial stenosis to explain such symptoms, suspicion for TIAs is low. Consider hemiplegic migraine, which would be diagnosis of exclusion. She reports no history of headaches. 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 3.  Neuropathy  1.  Start topiramate 25mg  at bedtime for 1 week, then 50mg  at bedtime.  We can increase dose to 75mg  at bedtime in 5 weeks if needed.  Side effects discussed, such as paresthesias and risk of kidney stones and glaucoma. 2.  Limit use of pain relievers to no more than 2 days out of week to prevent risk of rebound or medication-overuse headache. 3.  Will check SPEP/IFE to evaluate for other cause of neuropathy 4.  Follow up in 4 months.

## 2019-08-28 NOTE — Telephone Encounter (Signed)
Rx sent 

## 2019-08-28 NOTE — Telephone Encounter (Signed)
Requested Prescriptions   Pending Prescriptions Disp Refills  . topiramate (TOPAMAX) 50 MG tablet 90 tablet 1    Sig: Take 0.5 tablet at bedtime for one week, then increase to 1 tablet at bedtime.   Rx last filled 07/12/19 #30 0 refills

## 2019-08-28 NOTE — Telephone Encounter (Signed)
Pt aware Rx sent.  

## 2019-08-28 NOTE — Telephone Encounter (Signed)
Patient called to report she is doing well on her topiramate 50 MG and it is working well for her. She'd like to continue taking it and said she was told to call with an update for refills. Patient requesting the prescription in 90 day increments.  [employer] in [place]

## 2019-10-23 ENCOUNTER — Encounter: Payer: Self-pay | Admitting: Neurology

## 2019-11-13 NOTE — Progress Notes (Signed)
Virtual Visit via Video Note The purpose of this virtual visit is to provide medical care while limiting exposure to the novel coronavirus.    Consent was obtained for video visit:  Yes.   Answered questions that patient had about telehealth interaction:  Yes.   I discussed the limitations, risks, security and privacy concerns of performing an evaluation and management service by telemedicine. I also discussed with the patient that there may be a patient responsible charge related to this service. The patient expressed understanding and agreed to proceed.  Pt location: Home Physician Location: office Name of referring provider:  Sharion Balloon, FNP I connected with Ann Anderson at patients initiation/request on 11/14/2019 at 10:50 AM EST by video enabled telemedicine application and verified that I am speaking with the correct person using two identifiers. Pt MRN:  [id number] Pt DOB:  Apr 03, 1976 Video Participants:  Ann Anderson   History of Present Illness:  Ann Anderson is a 44 year old Caucasian woman with hypertension and hyperlipidemiaand anxietywhofollows up for stroke-like episodes and lower extremity numbness and tingling.  UPDATE: She started topiramate in September.  She had only one migraine over past 3 months.  It was severe, lasted about 5 hours.  She is pleased.   Current NSAIDs/analgesics:  ibuprofen Current muscle relaxant:  Flexeril Current antihypertensive:  HCTZ Current antiepileptic:  topiramate 50mg  at bedtime Other medication:  Plavix; atorvastatin 20mg   SPEP/IFE unremarkable.  HISTORY: She started having habitual episodes of left sided numbness and weakness in 2015. She has had a total of 5 episodes. MRIs have always been negative for stroke. In 2018, she had associated cognitive and speech difficulty. She was discharged on Plavix. Over the past year, she was started on Lipitor 20mg  and HCTZ.  She was admitted to [facility] early June  2027for another habitual episode presenting as acute onset left sided numbness of face, arm and leg as well as left arm and leg weakness. She also experienced anxiety attack with chest pain. When symptoms did not resolve after 2 days, she presented to the hospital for further evaluation. CT of head showed on acute abnormality. 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 Echocardiogram showed EF 63% with no significant valvular abnormalities. EKG demonstrated normal sinus rhythm. Cardiac workup otherwise negative as well. UA and urine drug screen were negative. Hgb A1c was 5.5. She was continued on Plavix and Lipitor as well as HCTZ. She reports slight left occipital headache associated with this last episode. Otherwise, she denies any history of headaches. Episodes usually last several hours. Following this past episode, she still reports mild residual left upper and lower extremity weakness. LDL in 2019 was 39.1.  Since that hospitalization in June, she reports increased migraines.  They are severe squeezing pain in the temples, back of head and neck with associated nausea.  They typically last a couple of hours to all day.  They occur 2 (sometimes 3) days a week.  She takes Tylenol 500mg .  Advil upsets her stomach.  She also reports history of lower extremity numbness with burning and weakness causing several falls. She has chronic back pain with lumbar degenerative disease and sciatica. Symptoms fluctuate and sometimes requires use of a cane. She has been followed by neurologists since  2017. In 2017, B12 was 442, TSH was 2.55 and Hgb A1c was 5.5. She was last seen by neurology in [place] in August 2019.  Blood work revealed B12 404, TSH 3.32, ANA negative, RF <14, Sed Rate 16, SPEP/IFE negative for monoclonal gammopathy/M-spike.She was to undergo NCV-EMG but could not be performed as she was unable to tolerate the pain. She did have an MRI of the lumbar spine without contrast on 05/15/2019 which showed minimal multilevel disc desiccation anddisc bulge at L4-L5 but no significant central or neural foraminal stenosis.  Past antidepressant:  Lexapro  Past Medical History: Past Medical History:  Diagnosis Date  . Degenerative disc disease, lumbar   . Hyperlipidemia   . Hypertension   . Neuropathy     Medications: Outpatient Encounter Medications as of 11/14/2019  Medication Sig Note  . atorvastatin (LIPITOR) 20 MG tablet Take 1 tablet (20 mg total) by mouth daily.   . clopidogrel (PLAVIX) 75 MG tablet Take 1 tablet (75 mg total) by mouth daily.   . cyclobenzaprine (FLEXERIL) 10 MG tablet Take 1 tablet (10 mg total) by mouth daily. 07/12/2019: PRN  . escitalopram (LEXAPRO) 10 MG tablet Take 1 tablet (10 mg total) by mouth daily.   . hydrochlorothiazide (HYDRODIURIL) 12.5 MG tablet Take 1 tablet (12.5 mg total) by mouth daily.   Marland Kitchen ibuprofen (ADVIL,MOTRIN) 800 MG tablet Take by mouth as needed.    . topiramate (TOPAMAX) 50 MG tablet Take 0.5 tablet at bedtime for one week, then increase to 1 tablet at bedtime.   . vitamin E 100 UNIT capsule Take 100 Units by mouth daily.    No facility-administered encounter medications on file as of 11/14/2019.    Allergies: Allergies  Allergen Reactions  . Hydralazine Hives  . Nitrofurantoin Hives    Family History: Family History  Problem Relation Age of Onset  . Cancer Mother        breast  . Heart disease Father   . Dementia Father     Social History: Social History   Socioeconomic History  . Marital status: Divorced    Spouse name: Not on file  . Number of children: 1  . Years of education: Not on file  . Highest education level:  Not on file  Occupational History  . Occupation: [employer]  Tobacco Use  . Smoking status: Current Every Day Smoker    Packs/day: 1.00    Years: 6.00    Pack years: 6.00    Types: Cigarettes  . Smokeless tobacco: Never Used  . Tobacco comment: Restarted 2 years ago and trying to cut back.   Substance and Sexual Activity  . Alcohol use: Yes    Comment: Social  . Drug use: Never  . Sexual activity: Not Currently    Birth control/protection: Surgical  Other Topics Concern  . Not on file  Social History Narrative   Right handed   Lives in a single story home with daughter and cousins   Works at [employer]   Caffeine - trying to cut back now mountain dew 2 cans/day; coffee 6-18 oz   Assoc. Degree   Exercise at work - walks   Social Determinants of Corporate investment banker Strain:   . Difficulty of Paying Living Expenses: Not on file  Food Insecurity:   . Worried About Programme researcher, broadcasting/film/video in the Last Year: Not on file  . Ran Out of Food in the Last Year: Not on file  Transportation Needs:   .  Lack of Transportation (Medical): Not on file  . Lack of Transportation (Non-Medical): Not on file  Physical Activity:   . Days of Exercise per Week: Not on file  . Minutes of Exercise per Session: Not on file  Stress:   . Feeling of Stress : Not on file  Social Connections:   . Frequency of Communication with Friends and Family: Not on file  . Frequency of Social Gatherings with Friends and Family: Not on file  . Attends Religious Services: Not on file  . Active Member of Clubs or Organizations: Not on file  . Attends Banker Meetings: Not on file  . Marital Status: Not on file  Intimate Partner Violence:   . Fear of Current or Ex-Partner: Not on file  . Emotionally Abused: Not on file  . Physically Abused: Not on file  . Sexually Abused: Not on file    Observations/Objective:   Height 5' 4.5" (1.638 m), weight 185 lb 9.6 oz (84.2 kg). No acute  distress.  Alert and oriented.  Speech fluent and not dysarthric.  Language intact.  Assessment and Plan:   1.  Migraine without aura, without status migrainosus, not intractable. 2.  Recurrent episodes of left-sided numbness and weakness.  Given the habitual semiology of symptoms over the past 5 years, as well as no focal arterial stenosis to explain such symptoms, suspicion for TIAs is low. Consider hemiplegic migraine, which would be diagnosis of exclusion. She reports no history of headaches. 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 3.  Neuropathy  1.  Topiramate 50mg  at bedtime 2.  Limit use of pain relievers to no more than 2 days out of week to prevent risk of rebound or medication-overuse headache. 3.  Keep headache diary 4.  Follow up in 5 months.  Follow Up Instructions:    -I discussed the assessment and treatment plan with the patient. The patient was provided an opportunity to ask questions and all were answered. The patient agreed with the plan and demonstrated an understanding of the instructions.   The patient was advised to call back or seek an in-person evaluation if the symptoms worsen or if the condition fails to improve as anticipated.   , DO

## 2019-11-14 ENCOUNTER — Other Ambulatory Visit: Payer: Self-pay

## 2019-11-14 ENCOUNTER — Encounter: Payer: Self-pay | Admitting: Neurology

## 2019-11-14 ENCOUNTER — Telehealth (INDEPENDENT_AMBULATORY_CARE_PROVIDER_SITE_OTHER): Payer: No Typology Code available for payment source | Admitting: Neurology

## 2019-11-14 VITALS — Ht 64.5 in | Wt 185.6 lb

## 2019-11-14 DIAGNOSIS — R200 Anesthesia of skin: Secondary | ICD-10-CM | POA: Diagnosis not present

## 2019-11-14 DIAGNOSIS — Z7902 Long term (current) use of antithrombotics/antiplatelets: Secondary | ICD-10-CM

## 2019-11-14 DIAGNOSIS — R531 Weakness: Secondary | ICD-10-CM

## 2019-11-14 DIAGNOSIS — G629 Polyneuropathy, unspecified: Secondary | ICD-10-CM

## 2019-11-14 DIAGNOSIS — G43409 Hemiplegic migraine, not intractable, without status migrainosus: Secondary | ICD-10-CM

## 2019-11-14 DIAGNOSIS — G43009 Migraine without aura, not intractable, without status migrainosus: Secondary | ICD-10-CM | POA: Diagnosis not present

## 2019-11-14 DIAGNOSIS — Z791 Long term (current) use of non-steroidal anti-inflammatories (NSAID): Secondary | ICD-10-CM

## 2019-11-14 DIAGNOSIS — I679 Cerebrovascular disease, unspecified: Secondary | ICD-10-CM

## 2020-03-03 IMAGING — MR MRI LUMBAR SPINE WITHOUT CONTRAST
4 of 5 series · 19 of 48 positions shown · non-contrast
Comparison: No prior studies available for comparison

CLINICAL DATA: Bilateral leg numbness.

EXAM:
MRI LUMBAR SPINE WITHOUT CONTRAST
TECHNIQUE: Multiplanar, multisequence MR imaging of the lumbar spine was
performed. No intravenous contrast was administered.

[Series 2: T2 · sagittal · 4.0mm · 0.55mm/px · 6 of 16 slices shown (1 of 2)]
[im 1/16]
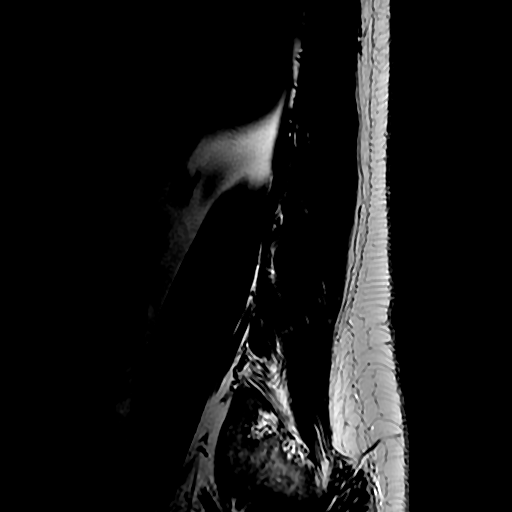
[im 4/16]
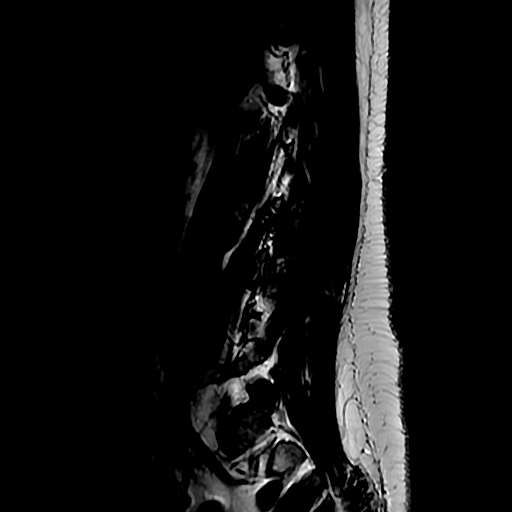
[im 7/16]
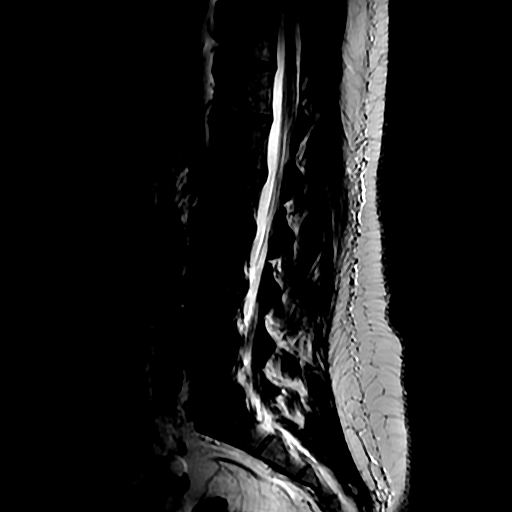
[im 10/16]
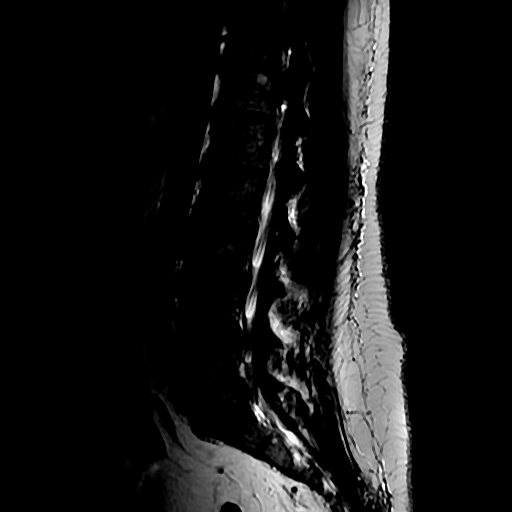
[im 13/16]
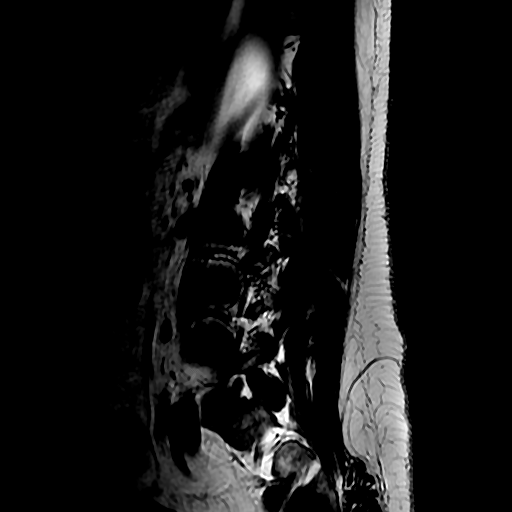
[im 16/16]
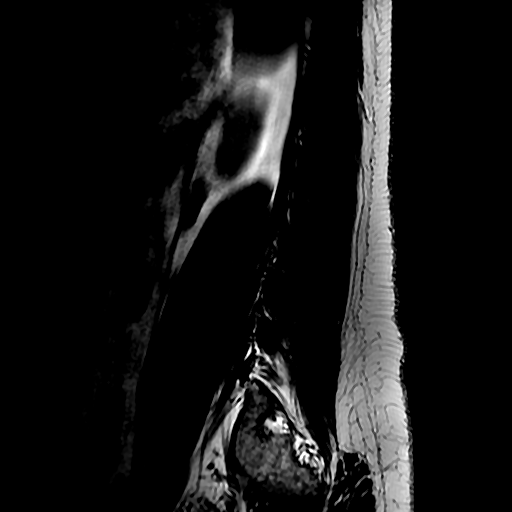

[Series 4: T1 · sagittal · 4.0mm · 0.55mm/px · 3 of 16 slices shown (1 of 2)]
[im 3/16]
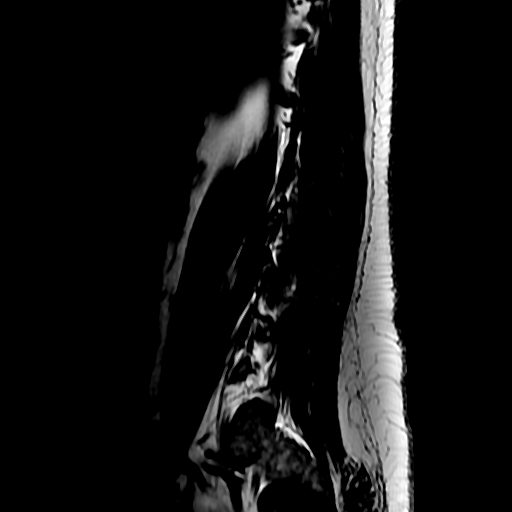
[im 8/16]
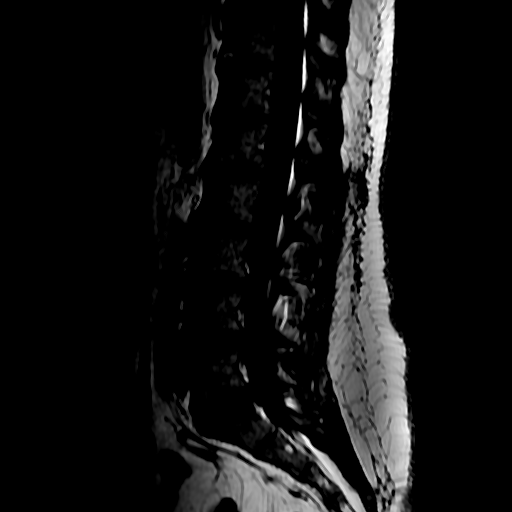
[im 13/16]
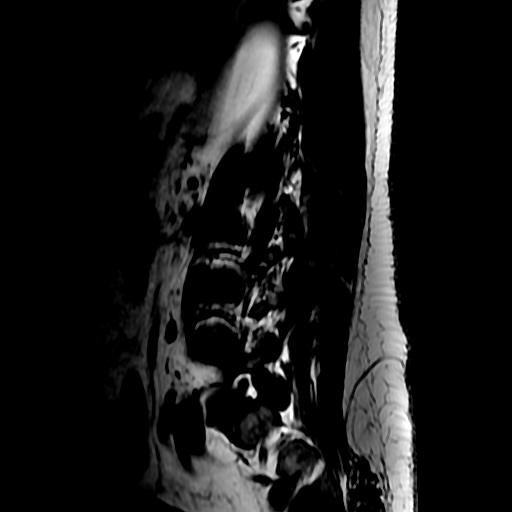

[Series 5: T2 · axial · 4.0mm · 0.39mm/px · z∈[-28,+125]mm · 7 of 32 slices shown (2 of 2)]
[im 1/32]
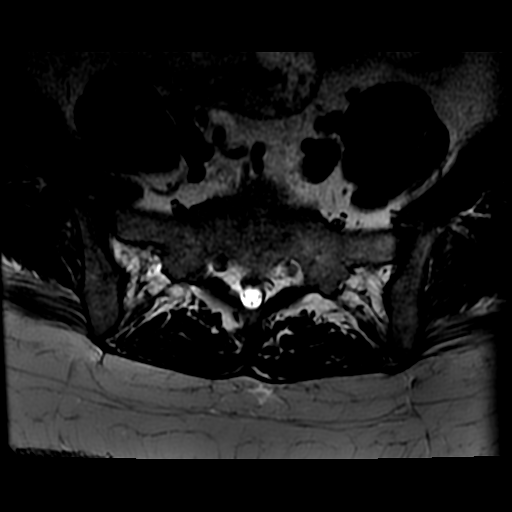
[im 5/32]
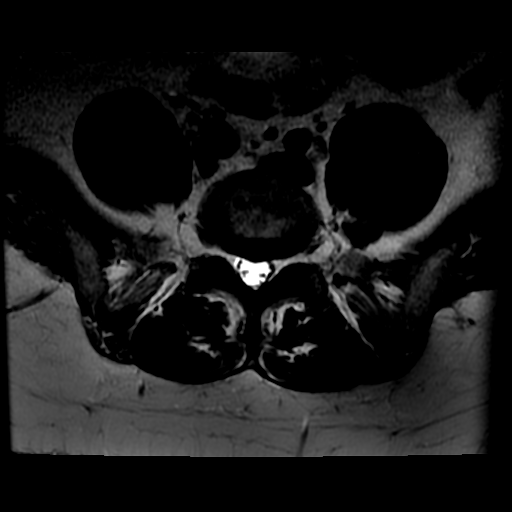
[im 10/32]
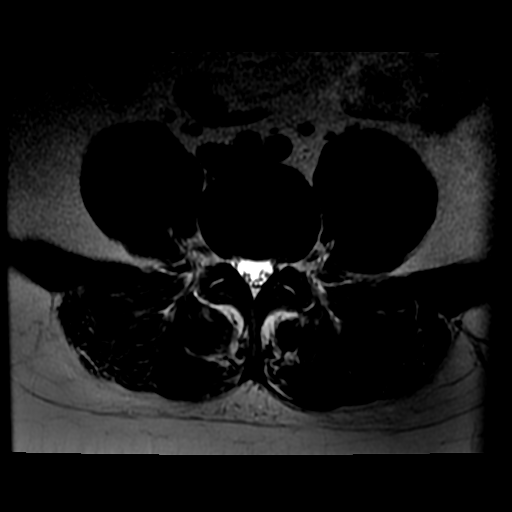
[im 15/32]
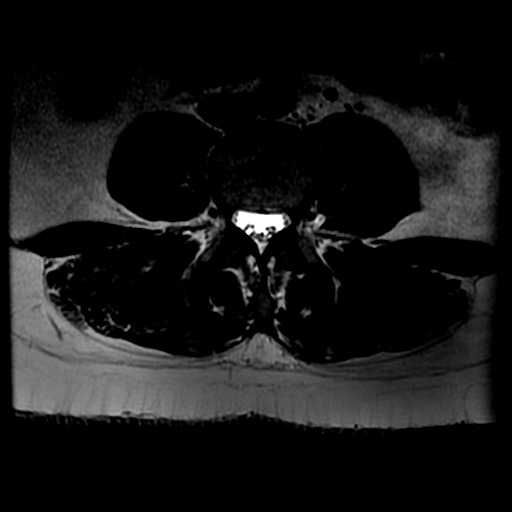
[im 17/32]
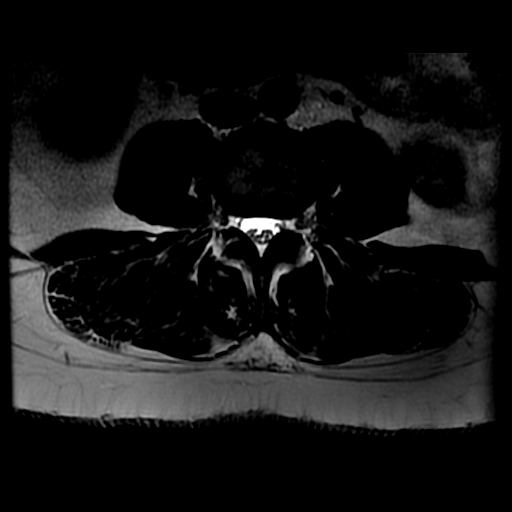
[im 22/32]
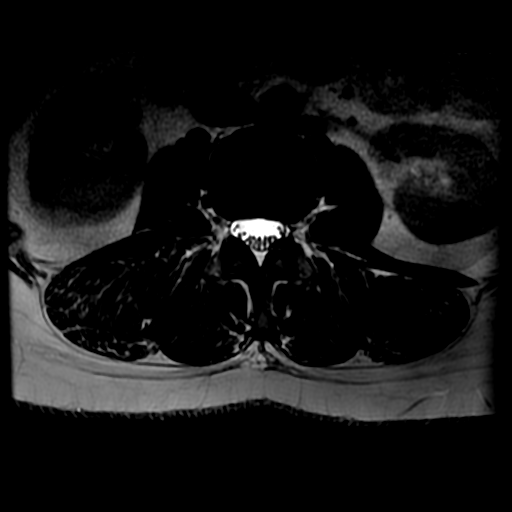
[im 27/32]
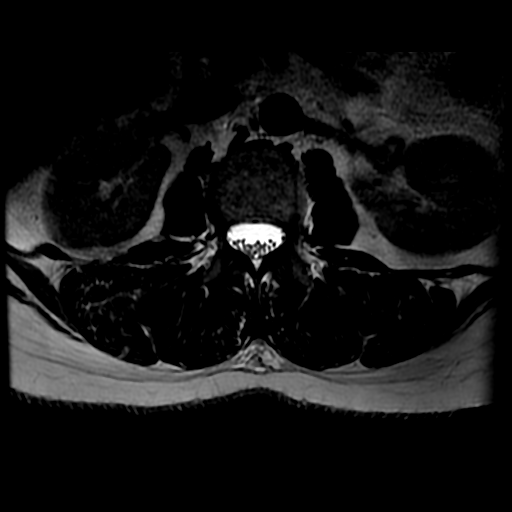

[Series 6: T1 · axial · 4.0mm · 0.39mm/px · z∈[-8,+125]mm · 3 of 32 slices shown (2 of 2)]
[im 5/32]
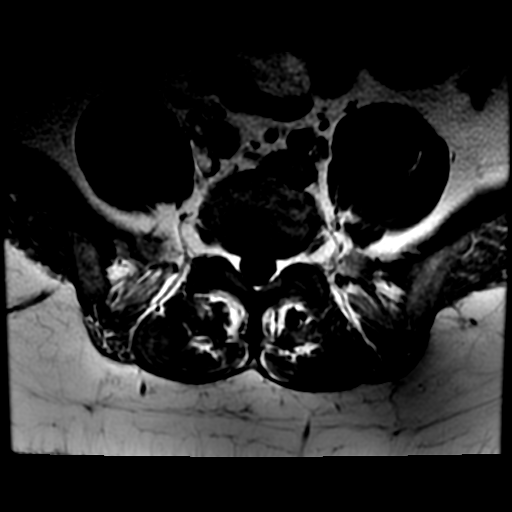
[im 17/32]
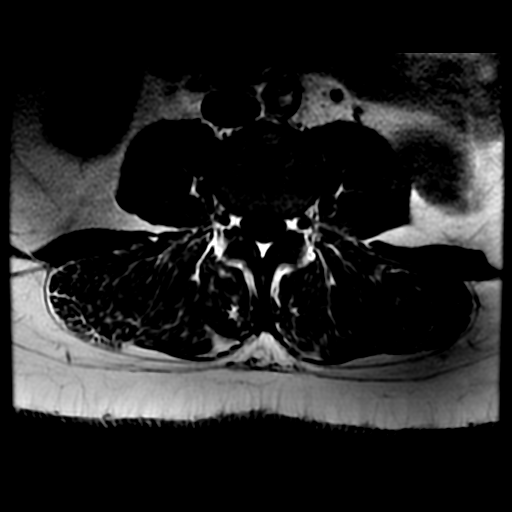
[im 27/32]
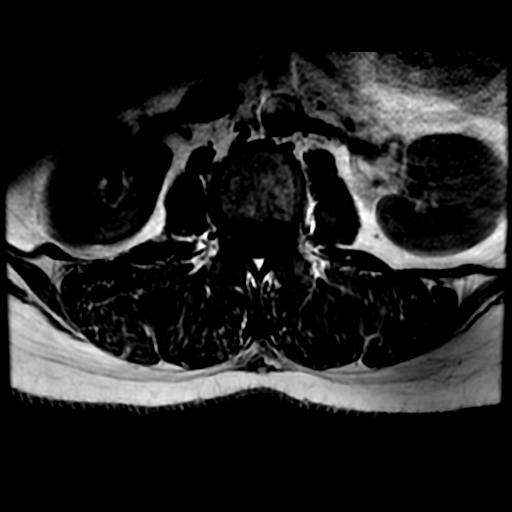

[19 of 48 positions shown; findings below may reference images not displayed]

FINDINGS: Segmentation: For the purposes of this dictation, five lumbar
vertebrae are assumed and the caudal most well-formed intervertebral
disc is designated L5-S1.

Alignment: Straightening of the expected lumbar lordosis. No
significant spondylolisthesis.

Vertebrae: Vertebral body height is maintained. No suspicious
osseous lesions. Tiny multilevel Schmorl nodes.

Conus medullaris and cauda equina: Conus extends to the L1-L2 level.
Conus and cauda equina appear normal. Small multilevel nerve root
sheath cysts.

Paraspinal and other soft tissues: The imaged paraspinal soft
tissues are unremarkable.

Disc levels:

Minimal multilevel disc desiccation. Intervertebral disc height is
preserved within the lumbar spine

At the L1-L2 through L3-L4 levels, No significant disc herniation,
spinal canal stenosis or neural foraminal narrowing.

L4-L5: Minimal disc bulge. No significant spinal canal or neural
foraminal narrowing.

L5-S1: No significant disc herniation, spinal canal stenosis or
neural foraminal narrowing.
IMPRESSION: Minimal L4-L5 disc bulge. No significant spinal canal stenosis or
neural foraminal narrowing within the lumbar spine.

## 2020-03-22 ENCOUNTER — Other Ambulatory Visit: Payer: Self-pay

## 2020-03-22 ENCOUNTER — Telehealth: Payer: Self-pay | Admitting: Neurology

## 2020-03-22 MED ORDER — TOPIRAMATE 50 MG PO TABS: ORAL_TABLET | ORAL | 1 refills | Status: DC

## 2020-03-22 NOTE — Telephone Encounter (Signed)
Sent request refill

## 2020-03-22 NOTE — Telephone Encounter (Signed)
Pt needs her medication for her headaches refilled she states that [facility] had sent over two requests and no one has gotten back with them she is out of medication  She uses the [facility]

## 2020-03-27 ENCOUNTER — Other Ambulatory Visit: Payer: Self-pay | Admitting: Neurology

## 2020-03-27 MED ORDER — TOPIRAMATE 50 MG PO TABS: 50.0000 mg | ORAL_TABLET | Freq: Every day | ORAL | 3 refills | Status: DC

## 2020-04-11 NOTE — Progress Notes (Deleted)
NEUROLOGY FOLLOW UP OFFICE NOTE  Ann Anderson [id number]  HISTORY OF PRESENT ILLNESS: Ann Anderson is a 44 year old Caucasian woman with hypertension and hyperlipidemiaand anxietywhofollows up for migraines and neuropathy.  UPDATE: Intensity:  *** Duration:  *** Frequency:  *** Current NSAIDs/analgesics:  ibuprofen Current muscle relaxant:  Flexeril Current antihypertensive:  HCTZ Current antiepileptic:  topiramate 50mg  at bedtime Other medication:  Plavix; atorvastatin 20mg    HISTORY: She started having habitual episodes of left sided numbness and weakness in 2015. She has had a total of 5 episodes. MRIs have always been negative for stroke. In 2018, she had associated cognitive and speech difficulty. She was discharged on Plavix. Over the past year, she was started on Lipitor 20mg  and HCTZ.  She was admitted to [facility] early June 2065for another habitual episode presenting as acute onset left sided numbness of face, arm and leg as well as left arm and leg weakness. She also experienced anxiety attack with chest pain. When symptoms did not resolve after 2 days, she presented to the hospital for further evaluation. CT of head showed on acute abnormality. 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 Echocardiogram showed EF 63% with no significant valvular abnormalities. EKG demonstrated normal sinus rhythm. Cardiac workup otherwise negative as well. UA and urine drug screen were negative. Hgb A1c was 5.5. She was continued on Plavix and Lipitor as well as HCTZ. She reports slight left occipital headache associated with this last episode. Otherwise, she denies any  history of headaches. Episodes usually last several hours. Following this past episode, she still reports mild residual left upper and lower extremity weakness. LDL in 2019 was 39.1.  Since that hospitalization in June, she reports increased migraines. They are severe squeezing pain in the temples, back of head and neck with associated nausea. They typically last a couple of hours to all day. They occur 2 (sometimes 3) days a week. She takes Tylenol 500mg . Advil upsets her stomach.  She also reports history of lower extremity numbness with burning and weakness causing several falls. She has chronic back pain with lumbar degenerative disease and sciatica. Symptoms fluctuate and sometimes requires use of a cane. She has been followed by neurologists since 2017. In 2017, B12 was 442, TSH was 2.55 and Hgb A1c was 5.5. She was last seen by neurology in July in August 2019. Blood work revealed B12 404, TSH 3.32, ANA negative, RF <14, Sed Rate 16, SPEP/IFE negative for monoclonal gammopathy/M-spike.She was to undergo NCV-EMG but could not be performed as she was unable to tolerate the pain. She did have an MRI of the lumbar spine without contrast on 05/15/2019 which showed minimal multilevel disc desiccation anddisc bulge at L4-L5 but no significant central or neural foraminal stenosis.  Past antidepressant:  Lexapro  PAST MEDICAL HISTORY: Past Medical History:  Diagnosis Date  . Degenerative disc disease, lumbar   . Hyperlipidemia   . Hypertension   . Neuropathy     MEDICATIONS: Current Outpatient Medications on File Prior to Visit  Medication Sig Dispense Refill  . atorvastatin (LIPITOR) 20 MG tablet Take 1 tablet (20 mg total) by mouth daily. 90 tablet 3  . clopidogrel (PLAVIX) 75 MG tablet Take 1 tablet (75 mg total) by mouth  daily. 90 tablet 1  . cyclobenzaprine (FLEXERIL) 10 MG tablet Take 1 tablet (10 mg total) by mouth daily. 60 tablet 2  . escitalopram (LEXAPRO) 10 MG  tablet Take 1 tablet (10 mg total) by mouth daily. (Patient not taking: Reported on 11/14/2019) 30 tablet 5  . hydrochlorothiazide (HYDRODIURIL) 12.5 MG tablet Take 1 tablet (12.5 mg total) by mouth daily. 90 tablet 3  . ibuprofen (ADVIL,MOTRIN) 800 MG tablet Take by mouth as needed.     . topiramate (TOPAMAX) 50 MG tablet Take 1 tablet (50 mg total) by mouth at bedtime. 30 tablet 3  . vitamin E 100 UNIT capsule Take 100 Units by mouth daily.     No current facility-administered medications on file prior to visit.    ALLERGIES: Allergies  Allergen Reactions  . Hydralazine Hives  . Nitrofurantoin Hives    FAMILY HISTORY: Family History  Problem Relation Age of Onset  . Cancer Mother        breast  . Heart disease Father   . Dementia Father     SOCIAL HISTORY: Social History   Socioeconomic History  . Marital status: Married    Spouse name: Not on file  . Number of children: 1  . Years of education: Not on file  . Highest education level: Not on file  Occupational History  . Occupation: [employer]  Tobacco Use  . Smoking status: Current Every Day Smoker    Packs/day: 1.00    Years: 6.00    Pack years: 6.00    Types: Cigarettes  . Smokeless tobacco: Never Used  . Tobacco comment: Restarted 2 years ago and trying to cut back.   Vaping Use  . Vaping Use: Never used  Substance and Sexual Activity  . Alcohol use: Yes    Comment: Social  . Drug use: Never  . Sexual activity: Not Currently    Birth control/protection: Surgical  Other Topics Concern  . Not on file  Social History Narrative   Right handed   Lives in a single story home with daughter and cousins   Works at [employer]   Caffeine - trying to cut back now mountain dew 2 cans/day; coffee 6-18 oz   Assoc. Degree   Exercise at work - walks   Social Determinants of Corporate investment banker Strain:   . Difficulty of Paying Living Expenses:   Food Insecurity:   . Worried About Programme researcher, broadcasting/film/video  in the Last Year:   . Barista in the Last Year:   Transportation Needs:   . Freight forwarder (Medical):   Marland Kitchen Lack of Transportation (Non-Medical):   Physical Activity:   . Days of Exercise per Week:   . Minutes of Exercise per Session:   Stress:   . Feeling of Stress :   Social Connections:   . Frequency of Communication with Friends and Family:   . Frequency of Social Gatherings with Friends and Family:   . Attends Religious Services:   . Active Member of Clubs or Organizations:   . Attends Banker Meetings:   Marland Kitchen Marital Status:   Intimate Partner Violence:   . Fear of Current or Ex-Partner:   . Emotionally Abused:   Marland Kitchen Physically Abused:   . Sexually Abused:     PHYSICAL EXAM: *** General: No acute distress.  Patient appears well-groomed.   Head:  Normocephalic/atraumatic Eyes:  Fundi examined but not visualized Neck: supple, no paraspinal tenderness, full  range of motion Heart:  Regular rate and rhythm Lungs:  Clear to auscultation bilaterally Back: No paraspinal tenderness Neurological Exam: alert and oriented to person, place, and time. Attention span and concentration intact, recent and remote memory intact, fund of knowledge intact.  Speech fluent and not dysarthric, language intact.  CN II-XII intact. Bulk and tone normal, muscle strength 5/5 throughout.  Sensation to light touch, temperature and vibration intact.  Deep tendon reflexes 2+ throughout, toes downgoing.  Finger to nose and heel to shin testing intact.  Gait normal, Romberg negative.  IMPRESSION: 1.  Migraine without aura, without status migrainosus, not intractable. 2.  Recurrent episodes of left-sided numbness and weakness, consider migraine with aura. 3.  Neuropathy  PLAN: 1.  For preventative management, *** 2.  For abortive therapy, *** 3.  Limit use of pain relievers to no more than 2 days out of week to prevent risk of rebound or medication-overuse headache. 4.  Keep  headache diary 5.  Exercise, hydration, caffeine cessation, sleep hygiene, monitor for and avoid triggers 6.  Follow up ***   Metta Clines, DO  CC: ***

## 2020-04-15 ENCOUNTER — Ambulatory Visit: Payer: No Typology Code available for payment source | Admitting: Neurology

## 2020-05-28 ENCOUNTER — Other Ambulatory Visit: Payer: Self-pay | Admitting: Family

## 2020-05-28 ENCOUNTER — Other Ambulatory Visit: Payer: Self-pay | Admitting: *Deleted

## 2020-05-28 DIAGNOSIS — I10 Essential (primary) hypertension: Secondary | ICD-10-CM

## 2020-05-28 MED ORDER — CLOPIDOGREL BISULFATE 75 MG PO TABS: 75.0000 mg | ORAL_TABLET | Freq: Every day | ORAL | 1 refills | Status: DC

## 2020-06-26 ENCOUNTER — Encounter: Payer: Self-pay | Admitting: *Deleted

## 2020-07-29 ENCOUNTER — Encounter: Payer: Medicaid Other | Admitting: Family

## 2020-07-30 ENCOUNTER — Encounter: Payer: Self-pay | Admitting: Family

## 2020-07-30 ENCOUNTER — Other Ambulatory Visit: Payer: Self-pay

## 2020-07-30 ENCOUNTER — Ambulatory Visit (INDEPENDENT_AMBULATORY_CARE_PROVIDER_SITE_OTHER): Payer: Managed Care, Other (non HMO) | Admitting: Family

## 2020-07-30 VITALS — BP 110/71 | HR 75 | Temp 98.2°F | Ht 64.5 in | Wt 169.4 lb

## 2020-07-30 DIAGNOSIS — M47816 Spondylosis without myelopathy or radiculopathy, lumbar region: Secondary | ICD-10-CM

## 2020-07-30 DIAGNOSIS — E785 Hyperlipidemia, unspecified: Secondary | ICD-10-CM

## 2020-07-30 DIAGNOSIS — I10 Essential (primary) hypertension: Secondary | ICD-10-CM | POA: Diagnosis not present

## 2020-07-30 DIAGNOSIS — Z720 Tobacco use: Secondary | ICD-10-CM

## 2020-07-30 DIAGNOSIS — Z0000 Encounter for general adult medical examination without abnormal findings: Secondary | ICD-10-CM

## 2020-07-30 DIAGNOSIS — Z0001 Encounter for general adult medical examination with abnormal findings: Secondary | ICD-10-CM

## 2020-07-30 DIAGNOSIS — Z114 Encounter for screening for human immunodeficiency virus [HIV]: Secondary | ICD-10-CM

## 2020-07-30 DIAGNOSIS — F331 Major depressive disorder, recurrent, moderate: Secondary | ICD-10-CM

## 2020-07-30 DIAGNOSIS — M5440 Lumbago with sciatica, unspecified side: Secondary | ICD-10-CM

## 2020-07-30 DIAGNOSIS — Z8673 Personal history of transient ischemic attack (TIA), and cerebral infarction without residual deficits: Secondary | ICD-10-CM

## 2020-07-30 DIAGNOSIS — G8929 Other chronic pain: Secondary | ICD-10-CM

## 2020-07-30 DIAGNOSIS — Z1159 Encounter for screening for other viral diseases: Secondary | ICD-10-CM

## 2020-07-30 MED ORDER — HYDROCHLOROTHIAZIDE 12.5 MG PO TABS: 12.5000 mg | ORAL_TABLET | Freq: Every day | ORAL | 1 refills | Status: DC

## 2020-07-30 MED ORDER — CYCLOBENZAPRINE HCL 10 MG PO TABS: 10.0000 mg | ORAL_TABLET | Freq: Every day | ORAL | 2 refills | Status: DC

## 2020-07-30 MED ORDER — CLOPIDOGREL BISULFATE 75 MG PO TABS: 75.0000 mg | ORAL_TABLET | Freq: Every day | ORAL | 1 refills | Status: DC

## 2020-07-30 MED ORDER — ATORVASTATIN CALCIUM 20 MG PO TABS: 20.0000 mg | ORAL_TABLET | Freq: Every day | ORAL | 1 refills | Status: DC

## 2020-07-30 NOTE — Patient Instructions (Signed)
Health Maintenance, Female Adopting a healthy lifestyle and getting preventive care are important in promoting health and wellness. Ask your health care provider about:  The right schedule for you to have regular tests and exams.  Things you can do on your own to prevent diseases and keep yourself healthy. What should I know about diet, weight, and exercise? Eat a healthy diet   Eat a diet that includes plenty of vegetables, fruits, low-fat dairy products, and lean protein.  Do not eat a lot of foods that are high in solid fats, added sugars, or sodium. Maintain a healthy weight Body mass index (BMI) is used to identify weight problems. It estimates body fat based on height and weight. Your health care provider can help determine your BMI and help you achieve or maintain a healthy weight. Get regular exercise Get regular exercise. This is one of the most important things you can do for your health. Most adults should:  Exercise for at least 150 minutes each week. The exercise should increase your heart rate and make you sweat (moderate-intensity exercise).  Do strengthening exercises at least twice a week. This is in addition to the moderate-intensity exercise.  Spend less time sitting. Even light physical activity can be beneficial. Watch cholesterol and blood lipids Have your blood tested for lipids and cholesterol at 44 years of age, then have this test every 5 years. Have your cholesterol levels checked more often if:  Your lipid or cholesterol levels are high.  You are older than 44 years of age.  You are at high risk for heart disease. What should I know about cancer screening? Depending on your health history and family history, you may need to have cancer screening at various ages. This may include screening for:  Breast cancer.  Cervical cancer.  Colorectal cancer.  Skin cancer.  Lung cancer. What should I know about heart disease, diabetes, and high blood  pressure? Blood pressure and heart disease  High blood pressure causes heart disease and increases the risk of stroke. This is more likely to develop in people who have high blood pressure readings, are of African descent, or are overweight.  Have your blood pressure checked: ? Every 3-5 years if you are 18-39 years of age. ? Every year if you are 40 years old or older. Diabetes Have regular diabetes screenings. This checks your fasting blood sugar level. Have the screening done:  Once every three years after age 40 if you are at a normal weight and have a low risk for diabetes.  More often and at a younger age if you are overweight or have a high risk for diabetes. What should I know about preventing infection? Hepatitis B If you have a higher risk for hepatitis B, you should be screened for this virus. Talk with your health care provider to find out if you are at risk for hepatitis B infection. Hepatitis C Testing is recommended for:  Everyone born from 1945 through 1965.  Anyone with known risk factors for hepatitis C. Sexually transmitted infections (STIs)  Get screened for STIs, including gonorrhea and chlamydia, if: ? You are sexually active and are younger than 44 years of age. ? You are older than 44 years of age and your health care provider tells you that you are at risk for this type of infection. ? Your sexual activity has changed since you were last screened, and you are at increased risk for chlamydia or gonorrhea. Ask your health care provider if   you are at risk.  Ask your health care provider about whether you are at high risk for HIV. Your health care provider may recommend a prescription medicine to help prevent HIV infection. If you choose to take medicine to prevent HIV, you should first get tested for HIV. You should then be tested every 3 months for as long as you are taking the medicine. Pregnancy  If you are about to stop having your period (premenopausal) and  you may become pregnant, seek counseling before you get pregnant.  Take 400 to 800 micrograms (mcg) of folic acid every day if you become pregnant.  Ask for birth control (contraception) if you want to prevent pregnancy. Osteoporosis and menopause Osteoporosis is a disease in which the bones lose minerals and strength with aging. This can result in bone fractures. If you are 65 years old or older, or if you are at risk for osteoporosis and fractures, ask your health care provider if you should:  Be screened for bone loss.  Take a calcium or vitamin D supplement to lower your risk of fractures.  Be given hormone replacement therapy (HRT) to treat symptoms of menopause. Follow these instructions at home: Lifestyle  Do not use any products that contain nicotine or tobacco, such as cigarettes, e-cigarettes, and chewing tobacco. If you need help quitting, ask your health care provider.  Do not use street drugs.  Do not share needles.  Ask your health care provider for help if you need support or information about quitting drugs. Alcohol use  Do not drink alcohol if: ? Your health care provider tells you not to drink. ? You are pregnant, may be pregnant, or are planning to become pregnant.  If you drink alcohol: ? Limit how much you use to 0-1 drink a day. ? Limit intake if you are breastfeeding.  Be aware of how much alcohol is in your drink. In the [place] one drink equals one 12 oz bottle of beer (355 mL), one 5 oz glass of wine (148 mL), or one 1 oz glass of hard liquor (44 mL). General instructions  Schedule regular health, dental, and eye exams.  Stay current with your vaccines.  Tell your health care provider if: ? You often feel depressed. ? You have ever been abused or do not feel safe at home. Summary  Adopting a healthy lifestyle and getting preventive care are important in promoting health and wellness.  Follow your health care provider's instructions about healthy  diet, exercising, and getting tested or screened for diseases.  Follow your health care provider's instructions on monitoring your cholesterol and blood pressure. This information is not intended to replace advice given to you by your health care provider. Make sure you discuss any questions you have with your health care provider. Document Revised: 10/05/2018 Document Reviewed: 10/05/2018 Elsevier Patient Education  2020 Elsevier Inc.  

## 2020-07-30 NOTE — Progress Notes (Signed)
Subjective:    Patient ID: Ann Anderson, female    DOB: 1975-12-19, 44 y.o.   MRN: [id number]  Chief Complaint  Patient presents with  . Annual Exam    no pap on cycle    PT presents to the office today for CPE without pap. She is followed by Neurologists every 6 months for migraines, hx of TIA's.  Hyperlipidemia This is a chronic problem. The current episode started more than 1 year ago. The problem is uncontrolled. Recent lipid tests were reviewed and are high. Pertinent negatives include no shortness of breath. Current antihyperlipidemic treatment includes statins. The current treatment provides moderate improvement of lipids. Risk factors for coronary artery disease include dyslipidemia, a sedentary lifestyle and hypertension.  Hypertension This is a chronic problem. The current episode started more than 1 year ago. The problem has been resolved since onset. The problem is controlled. Associated symptoms include anxiety and malaise/fatigue. Pertinent negatives include no peripheral edema ("some times") or shortness of breath. Risk factors for coronary artery disease include dyslipidemia and sedentary lifestyle. The current treatment provides moderate improvement. There is no history of heart failure. CVA: hx tia.  Migraine  This is a chronic problem. The current episode started more than 1 year ago. The problem occurs intermittently (last migraine was in Spring time). Associated symptoms include back pain, nausea, phonophobia and photophobia. She has tried beta blockers for the symptoms. The treatment provided moderate relief. Her past medical history is significant for hypertension.  Depression        This is a chronic problem.  The current episode started more than 1 year ago.   The onset quality is gradual.   The problem occurs intermittently.  Associated symptoms include irritable, restlessness, decreased interest and sad.  Associated symptoms include no helplessness and no  hopelessness.  Past treatments include nothing.  Past medical history includes anxiety.   Anxiety Presents for follow-up visit. Symptoms include excessive worry, irritability, nausea, nervous/anxious behavior and restlessness. Patient reports no shortness of breath.    Back Pain This is a chronic problem. The current episode started more than 1 year ago. The problem occurs intermittently. The pain is present in the lumbar spine. The pain is at a severity of 3/10. The pain is mild.      Review of Systems  Constitutional: Positive for irritability and malaise/fatigue.  Eyes: Positive for photophobia.  Respiratory: Negative for shortness of breath.   Gastrointestinal: Positive for nausea.  Musculoskeletal: Positive for back pain.  Psychiatric/Behavioral: Positive for depression. The patient is nervous/anxious.   All other systems reviewed and are negative.  Family History  Problem Relation Age of Onset  . Cancer Mother        breast  . Heart disease Father   . Dementia Father    Social History   Socioeconomic History  . Marital status: Married    Spouse name: Not on file  . Number of children: 1  . Years of education: Not on file  . Highest education level: Not on file  Occupational History  . Occupation: Ann Anderson  Tobacco Use  . Smoking status: Current Every Day Smoker    Packs/day: 1.00    Years: 6.00    Pack years: 6.00    Types: Cigarettes  . Smokeless tobacco: Never Used  . Tobacco comment: Restarted 2 years ago and trying to cut back.   Vaping Use  . Vaping Use: Never used  Substance and Sexual Activity  . Alcohol use:  Yes    Comment: Social  . Drug use: Never  . Sexual activity: Not Currently    Birth control/protection: Surgical  Other Topics Concern  . Not on file  Social History Narrative   Right handed   Lives in a single story home with daughter and cousins   Works at [employer]   Caffeine - trying to cut back now mountain dew 2 cans/day;  coffee 6-18 oz   Assoc. Degree   Exercise at work - walks   Social Determinants of Radio broadcast assistant Strain:   . Difficulty of Paying Living Expenses: Not on file  Food Insecurity:   . Worried About Charity fundraiser in the Last Year: Not on file  . Ran Out of Food in the Last Year: Not on file  Transportation Needs:   . Lack of Transportation (Medical): Not on file  . Lack of Transportation (Non-Medical): Not on file  Physical Activity:   . Days of Exercise per Week: Not on file  . Minutes of Exercise per Session: Not on file  Stress:   . Feeling of Stress : Not on file  Social Connections:   . Frequency of Communication with Friends and Family: Not on file  . Frequency of Social Gatherings with Friends and Family: Not on file  . Attends Religious Services: Not on file  . Active Member of Clubs or Organizations: Not on file  . Attends Archivist Meetings: Not on file  . Marital Status: Not on file       Objective:   Physical Exam Vitals reviewed.  Constitutional:      General: She is irritable. She is not in acute distress.    Appearance: She is well-developed.  HENT:     Head: Normocephalic and atraumatic.     Right Ear: Tympanic membrane normal.     Left Ear: Tympanic membrane normal.  Eyes:     Pupils: Pupils are equal, round, and reactive to light.  Neck:     Thyroid: No thyromegaly.  Cardiovascular:     Rate and Rhythm: Normal rate and regular rhythm.     Heart sounds: Normal heart sounds. No murmur heard.   Pulmonary:     Effort: Pulmonary effort is normal. No respiratory distress.     Breath sounds: Normal breath sounds. No wheezing.  Abdominal:     General: Bowel sounds are normal. There is no distension.     Palpations: Abdomen is soft.     Tenderness: There is no abdominal tenderness.  Musculoskeletal:        General: No tenderness. Normal range of motion.     Cervical back: Normal range of motion and neck supple.  Skin:     General: Skin is warm and dry.  Neurological:     Mental Status: She is alert and oriented to person, place, and time.     Cranial Nerves: No cranial nerve deficit.     Deep Tendon Reflexes: Reflexes are normal and symmetric.  Psychiatric:        Behavior: Behavior normal.        Thought Content: Thought content normal.        Judgment: Judgment normal.       BP 110/71   Pulse 75   Temp 98.2 F (36.8 C) (Temporal)   Ht 5' 4.5" (1.638 m)   Wt 169 lb 6.4 oz (76.8 kg)   SpO2 96%   BMI 28.63 kg/m  Assessment & Plan:  Ann Anderson comes in today with chief complaint of Annual Exam (no pap on cycle )   Diagnosis and orders addressed:  1. Essential hypertension - hydrochlorothiazide (HYDRODIURIL) 12.5 MG tablet; Take 1 tablet (12.5 mg total) by mouth daily.  Dispense: 90 tablet; Refill: 1 - CMP14+EGFR - CBC with Differential/Platelet  2. Annual physical exam - CMP14+EGFR - CBC with Differential/Platelet - Lipid panel - Hepatitis C antibody - TSH - HIV Antibody (routine testing w rflx)  3. Arthritis, lumbar spine - CMP14+EGFR - CBC with Differential/Platelet  4. Hyperlipidemia, unspecified hyperlipidemia type - atorvastatin (LIPITOR) 20 MG tablet; Take 1 tablet (20 mg total) by mouth daily.  Dispense: 90 tablet; Refill: 1 - CMP14+EGFR - CBC with Differential/Platelet  5. MDD (major depressive disorder), recurrent episode, moderate (HCC) - CMP14+EGFR - CBC with Differential/Platelet  6. Tobacco abuse - CMP14+EGFR - CBC with Differential/Platelet  7. Need for hepatitis C screening test - CMP14+EGFR - CBC with Differential/Platelet - Hepatitis C antibody  8. Encounter for screening for HIV - CMP14+EGFR - CBC with Differential/Platelet - HIV Antibody (routine testing w rflx)  9. History of TIAs - clopidogrel (PLAVIX) 75 MG tablet; Take 1 tablet (75 mg total) by mouth daily.  Dispense: 90 tablet; Refill: 1  10. Chronic low back pain with  sciatica, sciatica laterality unspecified, unspecified back pain laterality - cyclobenzaprine (FLEXERIL) 10 MG tablet; Take 1 tablet (10 mg total) by mouth daily.  Dispense: 60 tablet; Refill: 2 - CMP14+EGFR - CBC with Differential/Platelet   Labs pending Health Maintenance reviewed Diet and exercise encouraged  Follow up plan: 6 months    [name], FNP

## 2020-07-31 LAB — CMP14+EGFR
ALT: 19 IU/L (ref 0–32)
AST: 17 IU/L (ref 0–40)
Albumin/Globulin Ratio: 1.9 (ref 1.2–2.2)
Albumin: 4.2 g/dL (ref 3.8–4.8)
Alkaline Phosphatase: 59 IU/L (ref 44–121)
BUN/Creatinine Ratio: 18 (ref 9–23)
BUN: 14 mg/dL (ref 6–24)
Bilirubin Total: 0.3 mg/dL (ref 0.0–1.2)
CO2: 23 mmol/L (ref 20–29)
Calcium: 9 mg/dL (ref 8.7–10.2)
Chloride: 105 mmol/L (ref 96–106)
Creatinine, Ser: 0.76 mg/dL (ref 0.57–1.00)
GFR calc Af Amer: 110 mL/min/{1.73_m2} (ref >59)
GFR calc non Af Amer: 96 mL/min/{1.73_m2} (ref >59)
Globulin, Total: 2.2 g/dL (ref 1.5–4.5)
Glucose: 132 mg/dL — ABNORMAL HIGH (ref 65–99)
Potassium: 3.7 mmol/L (ref 3.5–5.2)
Sodium: 139 mmol/L (ref 134–144)
Total Protein: 6.4 g/dL (ref 6.0–8.5)

## 2020-07-31 LAB — CBC WITH DIFFERENTIAL/PLATELET
Basophils Absolute: 0.1 10*3/uL (ref 0.0–0.2)
Basos: 1 %
EOS (ABSOLUTE): 0.2 10*3/uL (ref 0.0–0.4)
Eos: 2 %
Hematocrit: 38.4 % (ref 34.0–46.6)
Hemoglobin: 12.8 g/dL (ref 11.1–15.9)
Immature Grans (Abs): 0 10*3/uL (ref 0.0–0.1)
Immature Granulocytes: 1 %
Lymphocytes Absolute: 2.4 10*3/uL (ref 0.7–3.1)
Lymphs: 27 %
MCH: 28.5 pg (ref 26.6–33.0)
MCHC: 33.3 g/dL (ref 31.5–35.7)
MCV: 86 fL (ref 79–97)
Monocytes Absolute: 0.5 10*3/uL (ref 0.1–0.9)
Monocytes: 5 %
Neutrophils Absolute: 5.7 10*3/uL (ref 1.4–7.0)
Neutrophils: 64 %
Platelets: 246 10*3/uL (ref 150–450)
RBC: 4.49 x10E6/uL (ref 3.77–5.28)
RDW: 13.5 % (ref 11.7–15.4)
WBC: 8.8 10*3/uL (ref 3.4–10.8)

## 2020-07-31 LAB — LIPID PANEL
Chol/HDL Ratio: 3.6 ratio (ref 0.0–4.4)
Cholesterol, Total: 145 mg/dL (ref 100–199)
HDL: 40 mg/dL (ref >39)
LDL Chol Calc (NIH): 70 mg/dL (ref 0–99)
Triglycerides: 215 mg/dL — ABNORMAL HIGH (ref 0–149)
VLDL Cholesterol Cal: 35 mg/dL (ref 5–40)

## 2020-07-31 LAB — HIV ANTIBODY (ROUTINE TESTING W REFLEX): HIV Screen 4th Generation wRfx: NONREACTIVE

## 2020-07-31 LAB — TSH: TSH: 2.15 u[IU]/mL (ref 0.450–4.500)

## 2020-07-31 LAB — HEPATITIS C ANTIBODY: Hep C Virus Ab: <0.1 s/co ratio (ref 0.0–0.9)

## 2020-08-07 LAB — SPECIMEN STATUS REPORT

## 2020-08-22 ENCOUNTER — Telehealth: Payer: Self-pay

## 2020-08-22 MED ORDER — ROPINIROLE HCL 1 MG PO TABS: 1.0000 mg | ORAL_TABLET | Freq: Every day | ORAL | 2 refills | Status: DC

## 2020-08-22 NOTE — Telephone Encounter (Signed)
Patient aware and will keep appointment next month.

## 2020-08-22 NOTE — Telephone Encounter (Signed)
Requip 1 mg Prescription sent to pharmacy. Keep appointment next month.

## 2020-08-22 NOTE — Telephone Encounter (Signed)
Left message to call back  

## 2020-09-13 ENCOUNTER — Other Ambulatory Visit: Payer: Self-pay

## 2020-09-13 ENCOUNTER — Ambulatory Visit (INDEPENDENT_AMBULATORY_CARE_PROVIDER_SITE_OTHER): Payer: Managed Care, Other (non HMO) | Admitting: Family

## 2020-09-13 ENCOUNTER — Encounter: Payer: Self-pay | Admitting: Family

## 2020-09-13 VITALS — BP 129/74 | HR 58 | Temp 97.9°F | Ht 64.5 in | Wt 168.2 lb

## 2020-09-13 DIAGNOSIS — Z720 Tobacco use: Secondary | ICD-10-CM

## 2020-09-13 DIAGNOSIS — G2581 Restless legs syndrome: Secondary | ICD-10-CM

## 2020-09-13 MED ORDER — ROPINIROLE HCL 0.5 MG PO TABS: 0.5000 mg | ORAL_TABLET | Freq: Every day | ORAL | 1 refills | Status: DC

## 2020-09-13 NOTE — Patient Instructions (Signed)

## 2020-09-13 NOTE — Progress Notes (Signed)
   Subjective:    Patient ID: Ann Anderson, female    DOB: Feb 02, 1976, 44 y.o.   MRN: [id number]  Chief Complaint  Patient presents with  . Restless Legs    stopped taking the requip about a week ago   . Leg Pain   Pt presents to the office today to discuss RLS. She reports she stopped the Requip about a week ago. She reports her restless is worse now. She reports it made her feel "funny" and foggy headed.   She does report she has decreased drinking Mt Dew which has seemed to slightly help.   Leg Pain   Nicotine Dependence Presents for follow-up visit. Her urge triggers include company of smokers. The symptoms have been stable. She smokes < 1/2 a pack of cigarettes per day.     Review of Systems  All other systems reviewed and are negative.      Objective:   Physical Exam Vitals reviewed.  Constitutional:      General: She is not in acute distress.    Appearance: She is well-developed.  HENT:     Head: Normocephalic and atraumatic.     Right Ear: Tympanic membrane normal.     Left Ear: Tympanic membrane normal.  Eyes:     Pupils: Pupils are equal, round, and reactive to light.  Neck:     Thyroid: No thyromegaly.  Cardiovascular:     Rate and Rhythm: Normal rate and regular rhythm.     Heart sounds: Normal heart sounds. No murmur heard.   Pulmonary:     Effort: Pulmonary effort is normal. No respiratory distress.     Breath sounds: Normal breath sounds. No wheezing.  Abdominal:     General: Bowel sounds are normal. There is no distension.     Palpations: Abdomen is soft.     Tenderness: There is no abdominal tenderness.  Musculoskeletal:        General: No tenderness. Normal range of motion.     Cervical back: Normal range of motion and neck supple.  Skin:    General: Skin is warm and dry.  Neurological:     Mental Status: She is alert and oriented to person, place, and time.     Cranial Nerves: No cranial nerve deficit.     Deep Tendon Reflexes:  Reflexes are normal and symmetric.  Psychiatric:        Behavior: Behavior normal.        Thought Content: Thought content normal.        Judgment: Judgment normal.       BP 129/74   Pulse (!) 58   Temp 97.9 F (36.6 C) (Temporal)   Ht 5' 4.5" (1.638 m)   Wt 168 lb 3.2 oz (76.3 kg)   SpO2 99%   BMI 28.43 kg/m      Assessment & Plan:  Ann Anderson comes in today with chief complaint of Restless Legs (stopped taking the requip about a week ago ) and Leg Pain   Diagnosis and orders addressed:  1. RLS (restless legs syndrome) We will restart Requip today, but decrease to 0.5 mg from 1 mg Continue to decrease caffeine  Smoking cessation discussed  - rOPINIRole (REQUIP) 0.5 MG tablet; Take 1 tablet (0.5 mg total) by mouth at bedtime.  Dispense: 90 tablet; Refill: 1  2. Tobacco abuse  [name], [place]

## 2020-09-23 NOTE — Progress Notes (Signed)
NEUROLOGY FOLLOW UP OFFICE NOTE  Ann Anderson [id number]   Subjective:  Ann Anderson is a 44 year old Caucasian woman with hypertension and hyperlipidemiaand anxietywhofollows up for stroke-like episodes and lower extremity numbness and tingling.  UPDATE: She had a migraine last week which was the first since the summer.  She took Tylenol 1000mg  and resolved in 90 minutes.  She lost 30 pounds since starting topiramate but has leveled off.    Current NSAIDs/analgesics:  ibuprofen Current muscle relaxant:  Flexeril Current antihypertensive:  HCTZ Current antiepileptic:  topiramate 50mg  at bedtime Other medication:  Plavix; atorvastatin 20mg , Requip  HISTORY: She started having habitual episodes of left sided numbness and weakness in 2015. She has had a total of 5 episodes. MRIs have always been negative for stroke. In 2018, she had associated cognitive and speech difficulty. She was discharged on Plavix. Over the past year, she was started on Lipitor 20mg  and HCTZ.  She was [facility] to [facility] June 2041for another habitual episode presenting as acute onset left sided numbness of face, arm and leg as well as left arm and leg weakness. She also experienced anxiety attack with chest pain. When symptoms did not resolve after 2 days, she presented to the hospital for further evaluation. CT of head showed on acute abnormality. 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 Echocardiogram showed EF 63% with no significant valvular abnormalities. EKG demonstrated normal sinus rhythm. Cardiac workup otherwise negative as well. UA and urine drug screen were negative. Hgb A1c  was 5.5. She was continued on Plavix and Lipitor as well as HCTZ. She reports slight left occipital headache associated with this last episode. Otherwise, she denies any history of headaches. Episodes usually last several hours. Following this past episode, she still reports mild residual left upper and lower extremity weakness. LDL in 2019 was 39.1.  Since that hospitalization in June, she reports increased migraines. They are severe squeezing pain in the temples, back of head and neck with associated nausea. They typically last a couple of hours to all day. They occur 2 (sometimes 3) days a week. She takes Tylenol 500mg . Advil upsets her stomach.  She also reports history of lower extremity numbness with burning and weakness causing several falls. She has chronic back pain with lumbar degenerative disease and sciatica. Symptoms fluctuate and sometimes requires use of a cane. She has been followed by neurologists since 2017. In 2017, B12 was 442, TSH was 2.55 and Hgb A1c was 5.5. She was last seen by neurology in 2020 in August 2019. Blood work revealed B12 404, TSH 3.32, ANA negative, RF <14, Sed Rate 16, SPEP/IFE negative for monoclonal gammopathy/M-spike.She was to undergo NCV-EMG but could not be performed as she was unable to tolerate the pain. She did have an MRI of the lumbar spine without contrast on 05/15/2019 which showed minimal multilevel disc desiccation anddisc bulge at L4-L5 but no significant central or neural foraminal stenosis.  Past antidepressant:  Lexapro  PAST MEDICAL HISTORY: Past Medical History:  Diagnosis Date  . Degenerative disc disease, lumbar   . Hyperlipidemia   . Hypertension   . Neuropathy     MEDICATIONS: Current Outpatient Medications on File Prior to Visit  Medication Sig Dispense Refill  .  atorvastatin (LIPITOR) 20 MG tablet Take 1 tablet (20 mg total) by mouth daily. 90 tablet 1  . clopidogrel (PLAVIX) 75 MG tablet Take 1 tablet (75  mg total) by mouth daily. (Patient not taking: Reported on 09/13/2020) 90 tablet 1  . cyclobenzaprine (FLEXERIL) 10 MG tablet Take 1 tablet (10 mg total) by mouth daily. 60 tablet 2  . hydrochlorothiazide (HYDRODIURIL) 12.5 MG tablet Take 1 tablet (12.5 mg total) by mouth daily. 90 tablet 1  . ibuprofen (ADVIL,MOTRIN) 800 MG tablet Take by mouth as needed.     Marland Kitchen rOPINIRole (REQUIP) 0.5 MG tablet Take 1 tablet (0.5 mg total) by mouth at bedtime. 90 tablet 1  . topiramate (TOPAMAX) 50 MG tablet Take 1 tablet (50 mg total) by mouth at bedtime. 30 tablet 3  . vitamin E 100 UNIT capsule Take 100 Units by mouth daily.     No current facility-administered medications on file prior to visit.    ALLERGIES: Allergies  Allergen Reactions  . Hydralazine Hives  . Nitrofurantoin Hives    FAMILY HISTORY: Family History  Problem Relation Age of Onset  . Cancer Mother        breast  . Heart disease Father   . Dementia Father     SOCIAL HISTORY: Social History   Socioeconomic History  . Marital status: Married    Spouse name: Not on file  . Number of children: 1  . Years of education: Not on file  . Highest education level: Not on file  Occupational History  . Occupation: [employer]  Tobacco Use  . Smoking status: Current Every Day Smoker    Packs/day: 1.00    Years: 6.00    Pack years: 6.00    Types: Cigarettes  . Smokeless tobacco: Never Used  . Tobacco comment: Restarted 2 years ago and trying to cut back.   Vaping Use  . Vaping Use: Never used  Substance and Sexual Activity  . Alcohol use: Yes    Comment: Social  . Drug use: Never  . Sexual activity: Not Currently    Birth control/protection: Surgical  Other Topics Concern  . Not on file  Social History Narrative   Right handed   Lives in a single story home with daughter and cousins   Works at [employer]   Caffeine - trying to cut back now mountain dew 2 cans/day; coffee 6-18 oz   Assoc. Degree   Exercise at  work - walks   Social Determinants of Corporate investment banker Strain:   . Difficulty of Paying Living Expenses: Not on file  Food Insecurity:   . Worried About Programme researcher, broadcasting/film/video in the Last Year: Not on file  . Ran Out of Food in the Last Year: Not on file  Transportation Needs:   . Lack of Transportation (Medical): Not on file  . Lack of Transportation (Non-Medical): Not on file  Physical Activity:   . Days of Exercise per Week: Not on file  . Minutes of Exercise per Session: Not on file  Stress:   . Feeling of Stress : Not on file  Social Connections:   . Frequency of Communication with Friends and Family: Not on file  . Frequency of Social Gatherings with Friends and Family: Not on file  . Attends Religious Services: Not on file  . Active Member of Clubs or Organizations: Not on file  . Attends Banker Meetings: Not on file  . Marital Status: Not on  file  Intimate Partner Violence:   . Fear of Current or Ex-Partner: Not on file  . Emotionally Abused: Not on file  . Physically Abused: Not on file  . Sexually Abused: Not on file     Objective:  Blood pressure 126/83, pulse 62, height 5\' 5"  (1.651 m), weight 170 lb (77.1 kg). General: No acute distress.  Patient appears well-groomed.   Head:  Normocephalic/atraumatic Eyes:  Fundi examined but not visualized Neck: supple, no paraspinal tenderness, full range of motion Heart:  Regular rate and rhythm Lungs:  Clear to auscultation bilaterally Back: No paraspinal tenderness Neurological Exam: alert and oriented to person, place, and time. Attention span and concentration intact, recent and remote memory intact, fund of knowledge intact.  Speech fluent and not dysarthric, language intact.  CN II-XII intact. Bulk and tone normal, muscle strength 5/5 throughout.  Sensation to light touch, temperature and vibration intact.  Deep tendon reflexes 2+ throughout, toes downgoing.  Finger to nose and heel to shin testing  intact.  Gait normal, Romberg negative.   Assessment/Plan:   1.  Migraine without aura, without status migrainosus, not intractable 2.  Recurrent episodes of left-sided numbness and weakness.  Given the habitual semiology of symptoms over the past 5 years, as well as no focal arterial stenosis to explain such symptoms, suspicion for TIAs is low. Consider hemiplegic migraine, which would be diagnosis of exclusion. She reports no history of headaches. 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   1.  Topiramate 50mg  at bedtime refilled. 2.  Tylenol as needed.  Limit use of pain relievers to no more than 2 days out of week to prevent risk of rebound or medication-overuse headache. 3.  Keep headache diary 4.  Follow up in one year  , DO  CC: , FNP

## 2020-09-25 ENCOUNTER — Encounter: Payer: Self-pay | Admitting: Neurology

## 2020-09-25 ENCOUNTER — Other Ambulatory Visit: Payer: Self-pay

## 2020-09-25 ENCOUNTER — Ambulatory Visit (INDEPENDENT_AMBULATORY_CARE_PROVIDER_SITE_OTHER): Payer: Managed Care, Other (non HMO) | Admitting: Neurology

## 2020-09-25 VITALS — BP 126/83 | HR 62 | Ht 65.0 in | Wt 170.0 lb

## 2020-09-25 DIAGNOSIS — G43409 Hemiplegic migraine, not intractable, without status migrainosus: Secondary | ICD-10-CM | POA: Diagnosis not present

## 2020-09-25 DIAGNOSIS — G43009 Migraine without aura, not intractable, without status migrainosus: Secondary | ICD-10-CM

## 2020-09-25 MED ORDER — TOPIRAMATE 50 MG PO TABS
50.0000 mg | ORAL_TABLET | Freq: Every day | ORAL | 1 refills | Status: DC
Start: 2020-09-25 — End: 2020-10-04

## 2020-09-25 NOTE — Patient Instructions (Signed)
1.  Continue topiramate 50mg  at bedtime 2.  Use Tylenol as needed.  Limit use of pain relievers to no more than 2 days out of week to prevent risk of rebound or medication-overuse headache. 3.  Keep headache diary

## 2020-10-03 ENCOUNTER — Other Ambulatory Visit: Payer: Self-pay | Admitting: Neurology

## 2020-10-04 ENCOUNTER — Telehealth: Payer: Self-pay | Admitting: Neurology

## 2020-10-04 MED ORDER — TOPIRAMATE 50 MG PO TABS
50.0000 mg | ORAL_TABLET | Freq: Every day | ORAL | 1 refills | Status: DC
Start: 2020-10-04 — End: 2021-07-14

## 2020-10-04 NOTE — Telephone Encounter (Signed)
Patient called in stating Dr. Everlena Cooper was supposed to get her a 90 day supply of her Topiramate sent to the [employer] on [facility] when she was in the office last week. They did not receive it.

## 2020-10-04 NOTE — Telephone Encounter (Signed)
Advised pt that the script was sent to [facility] pharmacy,  Will cancel script there and resend the script to [facility].   Script sent to the correct pharmacy and telephone call to [facility] pharmacy to cancel script there.

## 2021-02-14 ENCOUNTER — Other Ambulatory Visit: Payer: Self-pay

## 2021-02-14 ENCOUNTER — Ambulatory Visit (INDEPENDENT_AMBULATORY_CARE_PROVIDER_SITE_OTHER): Payer: Managed Care, Other (non HMO) | Admitting: Family Medicine

## 2021-02-14 ENCOUNTER — Encounter: Payer: Self-pay | Admitting: Family Medicine

## 2021-02-14 VITALS — BP 110/66 | HR 67 | Temp 98.2°F | Ht 65.0 in | Wt 171.4 lb

## 2021-02-14 DIAGNOSIS — R399 Unspecified symptoms and signs involving the genitourinary system: Secondary | ICD-10-CM | POA: Diagnosis not present

## 2021-02-14 DIAGNOSIS — N3289 Other specified disorders of bladder: Secondary | ICD-10-CM | POA: Diagnosis not present

## 2021-02-14 LAB — URINALYSIS, ROUTINE W REFLEX MICROSCOPIC
Bilirubin, UA: NEGATIVE
Glucose, UA: NEGATIVE
Ketones, UA: NEGATIVE
Nitrite, UA: NEGATIVE
Protein,UA: NEGATIVE
RBC, UA: NEGATIVE
Specific Gravity, UA: 1.02 (ref 1.005–1.030)
Urobilinogen, Ur: 0.2 mg/dL (ref 0.2–1.0)
pH, UA: 8 — ABNORMAL HIGH (ref 5.0–7.5)

## 2021-02-14 LAB — MICROSCOPIC EXAMINATION: RBC, Urine: NONE SEEN /hpf (ref 0–2)

## 2021-02-14 MED ORDER — PHENAZOPYRIDINE HCL 100 MG PO TABS: 100.0000 mg | ORAL_TABLET | Freq: Three times a day (TID) | ORAL | 0 refills | Status: DC | PRN

## 2021-02-14 MED ORDER — CEPHALEXIN 500 MG PO CAPS: 500.0000 mg | ORAL_CAPSULE | Freq: Two times a day (BID) | ORAL | 0 refills | Status: DC

## 2021-02-14 NOTE — Progress Notes (Signed)
Acute Office Visit  Subjective:    Patient ID: Ann Anderson, female    DOB: 11-08-1975, 45 y.o.   MRN: [id number]  Chief Complaint  Patient presents with  . bladder spasm    HPI Patient is in today for bladder spasm x 5 days. She reports lower abdominal pressure. She took some AZO for the first 3 days with good relief. But after discontinuing the AZO the symptoms returned. She reports that she has never had bladder pain this bad without having a UTI. She does reports pain after voiding. She typically has some level of urinary leaking and urgency at baseline. She denies nausea, vomiting, vaginal itching, discharge, fever, flank pain, or chills.   Past Medical History:  Diagnosis Date  . Degenerative disc disease, lumbar   . Hyperlipidemia   . Hypertension   . Neuropathy     Past Surgical History:  Procedure Laterality Date  . TUBAL LIGATION  2005    Family History  Problem Relation Age of Onset  . Cancer Mother        breast  . Heart disease Father   . Dementia Father     Social History   Socioeconomic History  . Marital status: Married    Spouse name: Not on file  . Number of children: 1  . Years of education: Not on file  . Highest education level: Not on file  Occupational History  . Occupation: [employer]  Tobacco Use  . Smoking status: Current Every Day Smoker    Packs/day: 1.00    Years: 6.00    Pack years: 6.00    Types: Cigarettes  . Smokeless tobacco: Never Used  . Tobacco comment: Restarted 2 years ago and trying to cut back.   Vaping Use  . Vaping Use: Never used  Substance and Sexual Activity  . Alcohol use: Yes    Comment: Social  . Drug use: Never  . Sexual activity: Not Currently    Birth control/protection: Surgical  Other Topics Concern  . Not on file  Social History Narrative   Right handed   Lives in a single story home with daughter and cousins   Works at [employer]   Caffeine - trying to cut back now mountain  dew 2 cans/day; coffee 6-18 oz   Assoc. Degree   Exercise at work - walks   Social Determinants of Corporate investment banker Strain: Not on file  Food Insecurity: Not on file  Transportation Needs: Not on file  Physical Activity: Not on file  Stress: Not on file  Social Connections: Not on file  Intimate Partner Violence: Not on file    Outpatient Medications Prior to Visit  Medication Sig Dispense Refill  . atorvastatin (LIPITOR) 20 MG tablet Take 1 tablet (20 mg total) by mouth daily. 90 tablet 1  . clopidogrel (PLAVIX) 75 MG tablet Take 1 tablet (75 mg total) by mouth daily. 90 tablet 1  . cyclobenzaprine (FLEXERIL) 10 MG tablet Take 1 tablet (10 mg total) by mouth daily. 60 tablet 2  . hydrochlorothiazide (HYDRODIURIL) 12.5 MG tablet Take 1 tablet (12.5 mg total) by mouth daily. 90 tablet 1  . ibuprofen (ADVIL,MOTRIN) 800 MG tablet Take by mouth as needed.     . Multiple Vitamin (MULTIVITAMIN) tablet Take 1 tablet by mouth daily.    Marland Kitchen topiramate (TOPAMAX) 50 MG tablet Take 1 tablet (50 mg total) by mouth at bedtime. 90 tablet 1  . vitamin E 100 UNIT  capsule Take 100 Units by mouth daily.    Marland Kitchen rOPINIRole (REQUIP) 0.5 MG tablet Take 1 tablet (0.5 mg total) by mouth at bedtime. 90 tablet 1  . rOPINIRole (REQUIP) 1 MG tablet Take 1 mg by mouth at bedtime.     No facility-administered medications prior to visit.    Allergies  Allergen Reactions  . Hydralazine Hives  . Nitrofurantoin Hives    Review of Systems As per HPI.     Objective:    Physical Exam Vitals and nursing note reviewed.  Constitutional:      General: She is not in acute distress.    Appearance: Normal appearance. She is not ill-appearing, toxic-appearing or diaphoretic.  Cardiovascular:     Heart sounds: Normal heart sounds. No murmur heard.   Pulmonary:     Effort: Pulmonary effort is normal. No respiratory distress.     Breath sounds: Normal breath sounds.  Abdominal:     General: Bowel sounds  are normal. There is no distension.     Palpations: Abdomen is soft.     Tenderness: There is abdominal tenderness in the suprapubic area. There is no right CVA tenderness, left CVA tenderness, guarding or rebound.  Skin:    General: Skin is warm and dry.  Neurological:     General: No focal deficit present.     Mental Status: She is alert and oriented to person, place, and time.     BP 110/66   Pulse 67   Temp 98.2 F (36.8 C) (Temporal)   Ht 5\' 5"  (1.651 m)   Wt 171 lb 6 oz (77.7 kg)   BMI 28.52 kg/m  Wt Readings from Last 3 Encounters:  02/14/21 171 lb 6 oz (77.7 kg)  09/25/20 170 lb (77.1 kg)  09/13/20 168 lb 3.2 oz (76.3 kg)    Health Maintenance Due  Topic Date Due  . COVID-19 Vaccine (1) Never done  . PAP SMEAR-Modifier  Never done  . COLONOSCOPY (Pts 45-3yrs Insurance coverage will need to be confirmed)  Never done    There are no preventive care reminders to display for this patient.   Lab Results  Component Value Date   TSH 2.150 07/30/2020   Lab Results  Component Value Date   WBC 8.8 07/30/2020   HGB 12.8 07/30/2020   HCT 38.4 07/30/2020   MCV 86 07/30/2020   PLT 246 07/30/2020   Lab Results  Component Value Date   NA 139 07/30/2020   K 3.7 07/30/2020   CO2 23 07/30/2020   GLUCOSE 132 (H) 07/30/2020   BUN 14 07/30/2020   CREATININE 0.76 07/30/2020   BILITOT 0.3 07/30/2020   ALKPHOS 59 07/30/2020   AST 17 07/30/2020   ALT 19 07/30/2020   PROT 6.4 07/30/2020   ALBUMIN 4.2 07/30/2020   CALCIUM 9.0 07/30/2020   Lab Results  Component Value Date   CHOL 145 07/30/2020   Lab Results  Component Value Date   HDL 40 07/30/2020   Lab Results  Component Value Date   LDLCALC 70 07/30/2020   Lab Results  Component Value Date   TRIG 215 (H) 07/30/2020   Lab Results  Component Value Date   CHOLHDL 3.6 07/30/2020   No results found for: HGBA1C     Assessment & Plan:   Ann Anderson was seen today for bladder spasm.  Diagnoses and all  orders for this visit:  UTI symptoms UA unremarkable. Culture is pending. Will try pyridium for bladder spasms. Pocket prescriptionKeflex sent  in for patient to start if pain does not improve or if symptoms worsen over the weekend. -     Urinalysis, Routine w reflex microscopic -     Urine Culture -     cephALEXin (KEFLEX) 500 MG capsule; Take 1 capsule (500 mg total) by mouth 2 (two) times daily.  Bladder spasms -     phenazopyridine (PYRIDIUM) 100 MG tablet; Take 1 tablet (100 mg total) by mouth 3 (three) times daily as needed for pain. -     Urine Culture -     cephALEXin (KEFLEX) 500 MG capsule; Take 1 capsule (500 mg total) by mouth 2 (two) times daily.   Return to office for new or worsening symptoms, or if symptoms persist. Reminded patient to schedule chronic follow up with PCP.    The patient indicates understanding of these issues and agrees with the plan.   Gabriel Earing, FNP

## 2021-02-16 LAB — URINE CULTURE

## 2021-02-24 ENCOUNTER — Encounter: Payer: Self-pay | Admitting: Family Medicine

## 2021-02-27 ENCOUNTER — Other Ambulatory Visit: Payer: Self-pay

## 2021-02-27 ENCOUNTER — Encounter: Payer: Self-pay | Admitting: Family

## 2021-02-27 ENCOUNTER — Ambulatory Visit (INDEPENDENT_AMBULATORY_CARE_PROVIDER_SITE_OTHER): Payer: Managed Care, Other (non HMO) | Admitting: Family

## 2021-02-27 VITALS — BP 125/81 | HR 73 | Temp 96.5°F | Ht 65.0 in | Wt 170.0 lb

## 2021-02-27 DIAGNOSIS — Z8673 Personal history of transient ischemic attack (TIA), and cerebral infarction without residual deficits: Secondary | ICD-10-CM

## 2021-02-27 DIAGNOSIS — Z720 Tobacco use: Secondary | ICD-10-CM | POA: Diagnosis not present

## 2021-02-27 DIAGNOSIS — E785 Hyperlipidemia, unspecified: Secondary | ICD-10-CM

## 2021-02-27 DIAGNOSIS — F331 Major depressive disorder, recurrent, moderate: Secondary | ICD-10-CM | POA: Diagnosis not present

## 2021-02-27 DIAGNOSIS — M47816 Spondylosis without myelopathy or radiculopathy, lumbar region: Secondary | ICD-10-CM

## 2021-02-27 NOTE — Patient Instructions (Signed)
Health Maintenance, Female Adopting a healthy lifestyle and getting preventive care are important in promoting health and wellness. Ask your health care provider about:  The right schedule for you to have regular tests and exams.  Things you can do on your own to prevent diseases and keep yourself healthy. What should I know about diet, weight, and exercise? Eat a healthy diet  Eat a diet that includes plenty of vegetables, fruits, low-fat dairy products, and lean protein.  Do not eat a lot of foods that are high in solid fats, added sugars, or sodium.   Maintain a healthy weight Body mass index (BMI) is used to identify weight problems. It estimates body fat based on height and weight. Your health care provider can help determine your BMI and help you achieve or maintain a healthy weight. Get regular exercise Get regular exercise. This is one of the most important things you can do for your health. Most adults should:  Exercise for at least 150 minutes each week. The exercise should increase your heart rate and make you sweat (moderate-intensity exercise).  Do strengthening exercises at least twice a week. This is in addition to the moderate-intensity exercise.  Spend less time sitting. Even light physical activity can be beneficial. Watch cholesterol and blood lipids Have your blood tested for lipids and cholesterol at 45 years of age, then have this test every 5 years. Have your cholesterol levels checked more often if:  Your lipid or cholesterol levels are high.  You are older than 45 years of age.  You are at high risk for heart disease. What should I know about cancer screening? Depending on your health history and family history, you may need to have cancer screening at various ages. This may include screening for:  Breast cancer.  Cervical cancer.  Colorectal cancer.  Skin cancer.  Lung cancer. What should I know about heart disease, diabetes, and high blood  pressure? Blood pressure and heart disease  High blood pressure causes heart disease and increases the risk of stroke. This is more likely to develop in people who have high blood pressure readings, are of African descent, or are overweight.  Have your blood pressure checked: ? Every 3-5 years if you are 18-39 years of age. ? Every year if you are 40 years old or older. Diabetes Have regular diabetes screenings. This checks your fasting blood sugar level. Have the screening done:  Once every three years after age 40 if you are at a normal weight and have a low risk for diabetes.  More often and at a younger age if you are overweight or have a high risk for diabetes. What should I know about preventing infection? Hepatitis B If you have a higher risk for hepatitis B, you should be screened for this virus. Talk with your health care provider to find out if you are at risk for hepatitis B infection. Hepatitis C Testing is recommended for:  Everyone born from 1945 through 1965.  Anyone with known risk factors for hepatitis C. Sexually transmitted infections (STIs)  Get screened for STIs, including gonorrhea and chlamydia, if: ? You are sexually active and are younger than 45 years of age. ? You are older than 45 years of age and your health care provider tells you that you are at risk for this type of infection. ? Your sexual activity has changed since you were last screened, and you are at increased risk for chlamydia or gonorrhea. Ask your health care provider   if you are at risk.  Ask your health care provider about whether you are at high risk for HIV. Your health care provider may recommend a prescription medicine to help prevent HIV infection. If you choose to take medicine to prevent HIV, you should first get tested for HIV. You should then be tested every 3 months for as long as you are taking the medicine. Pregnancy  If you are about to stop having your period (premenopausal) and  you may become pregnant, seek counseling before you get pregnant.  Take 400 to 800 micrograms (mcg) of folic acid every day if you become pregnant.  Ask for birth control (contraception) if you want to prevent pregnancy. Osteoporosis and menopause Osteoporosis is a disease in which the bones lose minerals and strength with aging. This can result in bone fractures. If you are 65 years old or older, or if you are at risk for osteoporosis and fractures, ask your health care provider if you should:  Be screened for bone loss.  Take a calcium or vitamin D supplement to lower your risk of fractures.  Be given hormone replacement therapy (HRT) to treat symptoms of menopause. Follow these instructions at home: Lifestyle  Do not use any products that contain nicotine or tobacco, such as cigarettes, e-cigarettes, and chewing tobacco. If you need help quitting, ask your health care provider.  Do not use street drugs.  Do not share needles.  Ask your health care provider for help if you need support or information about quitting drugs. Alcohol use  Do not drink alcohol if: ? Your health care provider tells you not to drink. ? You are pregnant, may be pregnant, or are planning to become pregnant.  If you drink alcohol: ? Limit how much you use to 0-1 drink a day. ? Limit intake if you are breastfeeding.  Be aware of how much alcohol is in your drink. In the [place] one drink equals one 12 oz bottle of beer (355 mL), one 5 oz glass of wine (148 mL), or one 1 oz glass of hard liquor (44 mL). General instructions  Schedule regular health, dental, and eye exams.  Stay current with your vaccines.  Tell your health care provider if: ? You often feel depressed. ? You have ever been abused or do not feel safe at home. Summary  Adopting a healthy lifestyle and getting preventive care are important in promoting health and wellness.  Follow your health care provider's instructions about healthy  diet, exercising, and getting tested or screened for diseases.  Follow your health care provider's instructions on monitoring your cholesterol and blood pressure. This information is not intended to replace advice given to you by your health care provider. Make sure you discuss any questions you have with your health care provider. Document Revised: 10/05/2018 Document Reviewed: 10/05/2018 Elsevier Patient Education  2021 Elsevier Inc.  

## 2021-02-27 NOTE — Progress Notes (Signed)
Subjective:    Patient ID: Ann Anderson [facility], female    DOB: January 21, 1976, 45 y.o.   MRN: [id number]  Chief Complaint  Patient presents with  . Follow-up   PT presents to the office today for chronic follow up. She is followed by Neurologists annually for migraines and TIA's.  Hyperlipidemia This is a chronic problem. The current episode started more than 1 year ago. The problem is controlled. Factors aggravating her hyperlipidemia include smoking. Pertinent negatives include no shortness of breath. Current antihyperlipidemic treatment includes statins. The current treatment provides moderate improvement of lipids.  Nicotine Dependence Presents for follow-up visit. Her urge triggers include company of smokers. She smokes < 1/2 a pack of cigarettes per day.  Hypertension This is a chronic problem. The current episode started more than 1 year ago. The problem has been resolved since onset. The problem is controlled. Pertinent negatives include no malaise/fatigue, peripheral edema or shortness of breath. Past treatments include diuretics. The current treatment provides moderate improvement.  Depression        This is a chronic problem.  The onset quality is gradual.   Associated symptoms include helplessness, hopelessness, irritable and restlessness. Arthritis Presents for follow-up visit. She complains of pain and stiffness. The symptoms have been stable. Affected locations include the left knee and right knee. Her pain is at a severity of 0/10.      Review of Systems  Constitutional: Negative for malaise/fatigue.  Respiratory: Negative for shortness of breath.   Musculoskeletal: Positive for arthritis and stiffness.  Psychiatric/Behavioral: Positive for depression.  All other systems reviewed and are negative.      Objective:   Physical Exam Vitals reviewed.  Constitutional:      General: She is irritable. She is not in acute distress.    Appearance: She is well-developed.   HENT:     Head: Normocephalic and atraumatic.     Right Ear: Tympanic membrane normal.     Left Ear: Tympanic membrane normal.  Eyes:     Pupils: Pupils are equal, round, and reactive to light.  Neck:     Thyroid: No thyromegaly.  Cardiovascular:     Rate and Rhythm: Normal rate and regular rhythm.     Heart sounds: Normal heart sounds. No murmur heard.   Pulmonary:     Effort: Pulmonary effort is normal. No respiratory distress.     Breath sounds: Normal breath sounds. No wheezing.  Abdominal:     General: Bowel sounds are normal. There is no distension.     Palpations: Abdomen is soft.     Tenderness: There is no abdominal tenderness.  Musculoskeletal:        General: No tenderness. Normal range of motion.     Cervical back: Normal range of motion and neck supple.  Skin:    General: Skin is warm and dry.  Neurological:     Mental Status: She is alert and oriented to person, place, and time.     Cranial Nerves: No cranial nerve deficit.     Deep Tendon Reflexes: Reflexes are normal and symmetric.  Psychiatric:        Behavior: Behavior normal.        Thought Content: Thought content normal.        Judgment: Judgment normal.       BP 125/81   Pulse 73   Temp (!) 96.5 F (35.8 C)   Ht '5\' 5"'  (1.651 m)   Wt 170 lb (77.1 kg)  LMP 02/18/2021   SpO2 96%   BMI 28.29 kg/m      Assessment & Plan:  [name] comes in today with chief complaint of Follow-up   Diagnosis and orders addressed:  1. MDD (major depressive disorder), recurrent episode, moderate (HCC) - CMP14+EGFR - CBC with Differential/Platelet  2. Hyperlipidemia, unspecified hyperlipidemia type - CMP14+EGFR - CBC with Differential/Platelet  3. Tobacco abuse - CMP14+EGFR - CBC with Differential/Platelet  4. History of TIAs - CMP14+EGFR - CBC with Differential/Platelet  5. Arthritis, lumbar spine - CMP14+EGFR - CBC with Differential/Platelet   Labs pending Health  Maintenance reviewed Diet and exercise encouraged  Follow up plan: 6 months    [name], FNP

## 2021-02-28 LAB — CMP14+EGFR
ALT: 26 IU/L (ref 0–32)
AST: 24 IU/L (ref 0–40)
Albumin/Globulin Ratio: 2 (ref 1.2–2.2)
Albumin: 4.3 g/dL (ref 3.8–4.8)
Alkaline Phosphatase: 59 IU/L (ref 44–121)
BUN/Creatinine Ratio: 16 (ref 9–23)
BUN: 12 mg/dL (ref 6–24)
Bilirubin Total: 0.4 mg/dL (ref 0.0–1.2)
CO2: 24 mmol/L (ref 20–29)
Calcium: 9.6 mg/dL (ref 8.7–10.2)
Chloride: 99 mmol/L (ref 96–106)
Creatinine, Ser: 0.76 mg/dL (ref 0.57–1.00)
Globulin, Total: 2.1 g/dL (ref 1.5–4.5)
Glucose: 83 mg/dL (ref 65–99)
Potassium: 4 mmol/L (ref 3.5–5.2)
Sodium: 137 mmol/L (ref 134–144)
Total Protein: 6.4 g/dL (ref 6.0–8.5)
eGFR: 98 mL/min/{1.73_m2} (ref >59)

## 2021-02-28 LAB — CBC WITH DIFFERENTIAL/PLATELET
Basophils Absolute: 0.1 10*3/uL (ref 0.0–0.2)
Basos: 1 %
EOS (ABSOLUTE): 0.3 10*3/uL (ref 0.0–0.4)
Eos: 4 %
Hematocrit: 39 % (ref 34.0–46.6)
Hemoglobin: 13.5 g/dL (ref 11.1–15.9)
Immature Grans (Abs): 0 10*3/uL (ref 0.0–0.1)
Immature Granulocytes: 0 %
Lymphocytes Absolute: 2.4 10*3/uL (ref 0.7–3.1)
Lymphs: 30 %
MCH: 29.5 pg (ref 26.6–33.0)
MCHC: 34.6 g/dL (ref 31.5–35.7)
MCV: 85 fL (ref 79–97)
Monocytes Absolute: 0.6 10*3/uL (ref 0.1–0.9)
Monocytes: 7 %
Neutrophils Absolute: 4.6 10*3/uL (ref 1.4–7.0)
Neutrophils: 58 %
Platelets: 265 10*3/uL (ref 150–450)
RBC: 4.58 x10E6/uL (ref 3.77–5.28)
RDW: 14.1 % (ref 11.7–15.4)
WBC: 8 10*3/uL (ref 3.4–10.8)

## 2021-03-12 ENCOUNTER — Encounter: Payer: Managed Care, Other (non HMO) | Admitting: Family Medicine

## 2021-03-12 NOTE — Progress Notes (Signed)
Called patient at 1203- LM, 1210-LM, and 1219-LM that patient will need to call the office to reschedule appointment.

## 2021-04-29 ENCOUNTER — Ambulatory Visit: Payer: Managed Care, Other (non HMO) | Admitting: Family

## 2021-05-13 ENCOUNTER — Ambulatory Visit (INDEPENDENT_AMBULATORY_CARE_PROVIDER_SITE_OTHER): Payer: Managed Care, Other (non HMO) | Admitting: Nurse Practitioner

## 2021-05-13 ENCOUNTER — Other Ambulatory Visit: Payer: Self-pay

## 2021-05-13 VITALS — BP 130/86 | HR 68 | Temp 98.2°F | Ht 65.0 in | Wt 173.0 lb

## 2021-05-13 DIAGNOSIS — R350 Frequency of micturition: Secondary | ICD-10-CM | POA: Diagnosis not present

## 2021-05-13 DIAGNOSIS — N393 Stress incontinence (female) (male): Secondary | ICD-10-CM | POA: Insufficient documentation

## 2021-05-13 LAB — MICROSCOPIC EXAMINATION: WBC, UA: NONE SEEN /hpf (ref 0–5)

## 2021-05-13 LAB — URINALYSIS, ROUTINE W REFLEX MICROSCOPIC
Bilirubin, UA: NEGATIVE
Glucose, UA: NEGATIVE
Ketones, UA: NEGATIVE
Leukocytes,UA: NEGATIVE
Nitrite, UA: NEGATIVE
Protein,UA: NEGATIVE
Specific Gravity, UA: 1.025 (ref 1.005–1.030)
Urobilinogen, Ur: 0.2 mg/dL (ref 0.2–1.0)
pH, UA: 6 (ref 5.0–7.5)

## 2021-05-13 MED ORDER — OXYBUTYNIN CHLORIDE ER 5 MG PO TB24: 5.0000 mg | ORAL_TABLET | Freq: Every day | ORAL | 0 refills | Status: DC

## 2021-05-13 NOTE — Assessment & Plan Note (Signed)
Unresolved symptoms of urinary frequency.  Completed urinalysis results pending.

## 2021-05-13 NOTE — Patient Instructions (Signed)

## 2021-05-13 NOTE — Progress Notes (Signed)
Acute Office Visit  Subjective:    Patient ID: Ann Anderson, female    DOB: 26-Nov-1975, 45 y.o.   MRN: [id number]  Chief Complaint  Patient presents with   Urinary Frequency    Dysuria  This is a recurrent problem. The current episode started 1 to 4 weeks ago. The problem occurs intermittently. The problem has been unchanged. The pain is mild. There has been no fever. Associated symptoms include frequency and urgency. Pertinent negatives include no chills, discharge or nausea. She has tried nothing for the symptoms.  Patient is also reporting urinary incontinence in the past 1 to 2 years.  Patient reports she has been wearing pads and now she is wearing depends and symptoms are worse.  Past Medical History:  Diagnosis Date   Degenerative disc disease, lumbar    Hyperlipidemia    Hypertension    Neuropathy     Past Surgical History:  Procedure Laterality Date   TUBAL LIGATION  2005    Family History  Problem Relation Age of Onset   Cancer Mother        breast   Heart disease Father    Dementia Father     Social History   Socioeconomic History   Marital status: Married    Spouse name: Not on file   Number of children: 1   Years of education: Not on file   Highest education level: Not on file  Occupational History   Occupation: [employer]  Tobacco Use   Smoking status: Every Day    Packs/day: 1.00    Years: 6.00    Pack years: 6.00    Types: Cigarettes   Smokeless tobacco: Never   Tobacco comments:    Restarted 2 years ago and trying to cut back.   Vaping Use   Vaping Use: Never used  Substance and Sexual Activity   Alcohol use: Yes    Comment: Social   Drug use: Never   Sexual activity: Not Currently    Birth control/protection: Surgical  Other Topics Concern   Not on file  Social History Narrative   Right handed   Lives in a single story home with daughter and cousins   Works at [employer]   Caffeine - trying to cut back now  mountain dew 2 cans/day; coffee 6-18 oz   [employer] Degree   Exercise at work - walks   Social Determinants of Radio broadcast assistant Strain: Not on file  Food Insecurity: Not on file  Transportation Needs: Not on file  Physical Activity: Not on file  Stress: Not on file  Social Connections: Not on file  Intimate Partner Violence: Not on file    Outpatient Medications Prior to Visit  Medication Sig Dispense Refill   atorvastatin (LIPITOR) 20 MG tablet Take 1 tablet (20 mg total) by mouth daily. 90 tablet 1   clopidogrel (PLAVIX) 75 MG tablet Take 1 tablet (75 mg total) by mouth daily. 90 tablet 1   cyclobenzaprine (FLEXERIL) 10 MG tablet Take 1 tablet (10 mg total) by mouth daily. 60 tablet 2   hydrochlorothiazide (HYDRODIURIL) 12.5 MG tablet Take 1 tablet (12.5 mg total) by mouth daily. 90 tablet 1   ibuprofen (ADVIL,MOTRIN) 800 MG tablet Take by mouth as needed.      Multiple Vitamin (MULTIVITAMIN) tablet Take 1 tablet by mouth daily.     topiramate (TOPAMAX) 50 MG tablet Take 1 tablet (50 mg total) by mouth at bedtime. 90 tablet 1  vitamin E 100 UNIT capsule Take 100 Units by mouth daily.     cephALEXin (KEFLEX) 500 MG capsule Take 1 capsule (500 mg total) by mouth 2 (two) times daily. 14 capsule 0   phenazopyridine (PYRIDIUM) 100 MG tablet Take 1 tablet (100 mg total) by mouth 3 (three) times daily as needed for pain. 10 tablet 0   No facility-administered medications prior to visit.    Allergies  Allergen Reactions   Hydralazine Hives   Nitrofurantoin Hives    Review of Systems  Constitutional:  Negative for chills.  HENT: Negative.    Respiratory: Negative.    Gastrointestinal: Negative.  Negative for diarrhea and nausea.  Genitourinary:  Positive for dysuria, frequency and urgency.  All other systems reviewed and are negative.     Objective:    Physical Exam Vitals and nursing note reviewed.  Constitutional:      Appearance: Normal appearance.  HENT:      Head: Normocephalic.     Nose: Nose normal.     Mouth/Throat:     Mouth: Mucous membranes are moist.     Pharynx: Oropharynx is clear.  Eyes:     Conjunctiva/sclera: Conjunctivae normal.  Cardiovascular:     Rate and Rhythm: Normal rate and regular rhythm.     Pulses: Normal pulses.     Heart sounds: Normal heart sounds.  Pulmonary:     Effort: Pulmonary effort is normal.     Breath sounds: Normal breath sounds.  Abdominal:     General: Bowel sounds are normal.     Tenderness: There is no right CVA tenderness or left CVA tenderness.  Skin:    Findings: No rash.  Neurological:     Mental Status: She is alert and oriented to person, place, and time.    BP 130/86   Pulse 68   Temp 98.2 F (36.8 C) (Temporal)   Ht _0  (1.651 m)   Wt 173 lb (78.5 kg)   SpO2 97%   BMI 28.79 kg/m  Wt Readings from Last 3 Encounters:  05/13/21 173 lb (78.5 kg)  02/27/21 170 lb (77.1 kg)  02/14/21 171 lb 6 oz (77.7 kg)    Health Maintenance Due  Topic Date Due   COVID-19 Vaccine (1) Never done   Pneumococcal Vaccine 20-57 Years old (1 - PCV) Never done   PAP SMEAR-Modifier  Never done    There are no preventive care reminders to display for this patient.   Lab Results  Component Value Date   TSH 2.150 07/30/2020   Lab Results  Component Value Date   WBC 8.0 02/27/2021   HGB 13.5 02/27/2021   HCT 39.0 02/27/2021   MCV 85 02/27/2021   PLT 265 02/27/2021   Lab Results  Component Value Date   NA 137 02/27/2021   K 4.0 02/27/2021   CO2 24 02/27/2021   GLUCOSE 83 02/27/2021   BUN 12 02/27/2021   CREATININE 0.76 02/27/2021   BILITOT 0.4 02/27/2021   ALKPHOS 59 02/27/2021   AST 24 02/27/2021   ALT 26 02/27/2021   PROT 6.4 02/27/2021   ALBUMIN 4.3 02/27/2021   CALCIUM 9.6 02/27/2021   EGFR 98 02/27/2021   Lab Results  Component Value Date   CHOL 145 07/30/2020   Lab Results  Component Value Date   HDL 40 07/30/2020   Lab Results  Component Value Date   LDLCALC 70  07/30/2020   Lab Results  Component Value Date   TRIG 215 (H)  07/30/2020   Lab Results  Component Value Date   CHOLHDL 3.6 07/30/2020   No results found for: HGBA1C     Assessment & Plan:   Problem List Items Addressed This Visit       Other   Urine frequency - Primary    Unresolved symptoms of urinary frequency.  Completed urinalysis results pending.       Relevant Medications   oxybutynin (DITROPAN XL) 5 MG 24 hr tablet   Other Relevant Orders   Urinalysis, Routine w reflex microscopic (Completed)   Stress incontinence of urine    Reporting symptoms of urinary incontinence in the last 1 to 2 years.  Started patient on Ditropan XL 5 mg tablet by mouth once daily.  Follow-up with worsening unresolved symptoms.  Education provided to patient printed handouts given. Rx sent to pharmacy.       Relevant Medications   oxybutynin (DITROPAN XL) 5 MG 24 hr tablet     Meds ordered this encounter  Medications   oxybutynin (DITROPAN XL) 5 MG 24 hr tablet    Sig: Take 1 tablet (5 mg total) by mouth at bedtime.    Dispense:  30 tablet    Refill:  0    Order Specific Question:   Supervising Provider    Answer:   Janora Norlander [[id number]]     Ivy Lynn, NP

## 2021-05-13 NOTE — Assessment & Plan Note (Signed)
Reporting symptoms of urinary incontinence in the last 1 to 2 years.  Started patient on Ditropan XL 5 mg tablet by mouth once daily.  Follow-up with worsening unresolved symptoms.  Education provided to patient printed handouts given. Rx sent to pharmacy.

## 2021-05-29 ENCOUNTER — Encounter: Payer: Self-pay | Admitting: Nurse Practitioner

## 2021-05-31 ENCOUNTER — Other Ambulatory Visit: Payer: Self-pay | Admitting: Nurse Practitioner

## 2021-05-31 DIAGNOSIS — R350 Frequency of micturition: Secondary | ICD-10-CM

## 2021-05-31 DIAGNOSIS — N393 Stress incontinence (female) (male): Secondary | ICD-10-CM

## 2021-05-31 MED ORDER — OXYBUTYNIN CHLORIDE ER 5 MG PO TB24: 5.0000 mg | ORAL_TABLET | Freq: Every day | ORAL | 1 refills | Status: DC

## 2021-06-10 ENCOUNTER — Other Ambulatory Visit: Payer: Self-pay | Admitting: Nurse Practitioner

## 2021-06-10 MED ORDER — OXYBUTYNIN CHLORIDE ER 10 MG PO TB24: 10.0000 mg | ORAL_TABLET | Freq: Every day | ORAL | 1 refills | Status: DC

## 2021-06-17 ENCOUNTER — Other Ambulatory Visit: Payer: Self-pay | Admitting: Family

## 2021-06-17 DIAGNOSIS — Z1231 Encounter for screening mammogram for malignant neoplasm of breast: Secondary | ICD-10-CM

## 2021-06-23 ENCOUNTER — Other Ambulatory Visit: Payer: Self-pay | Admitting: Family

## 2021-06-23 DIAGNOSIS — I10 Essential (primary) hypertension: Secondary | ICD-10-CM

## 2021-06-23 DIAGNOSIS — E785 Hyperlipidemia, unspecified: Secondary | ICD-10-CM

## 2021-07-12 ENCOUNTER — Other Ambulatory Visit: Payer: Self-pay | Admitting: Family

## 2021-07-12 ENCOUNTER — Other Ambulatory Visit: Payer: Self-pay | Admitting: Neurology

## 2021-07-12 DIAGNOSIS — E785 Hyperlipidemia, unspecified: Secondary | ICD-10-CM

## 2021-07-12 DIAGNOSIS — I10 Essential (primary) hypertension: Secondary | ICD-10-CM

## 2021-07-14 NOTE — Telephone Encounter (Signed)
Enough given until 09/26/2021 no further refills until seen

## 2021-09-01 ENCOUNTER — Encounter: Payer: Managed Care, Other (non HMO) | Admitting: Family

## 2021-09-01 ENCOUNTER — Encounter: Payer: Self-pay | Admitting: Family

## 2021-09-01 ENCOUNTER — Inpatient Hospital Stay: Admission: RE | Admit: 2021-09-01 | Payer: Managed Care, Other (non HMO) | Source: Ambulatory Visit

## 2021-09-02 ENCOUNTER — Encounter: Payer: Managed Care, Other (non HMO) | Admitting: Family

## 2021-09-25 NOTE — Progress Notes (Deleted)
Virtual Visit via Video Note The purpose of this virtual visit is to provide medical care while limiting exposure to the novel coronavirus.    Consent was obtained for video visit:  {yes no:314532} Answered questions that patient had about telehealth interaction:  {yes no:314532} I discussed the limitations, risks, security and privacy concerns of performing an evaluation and management service by telemedicine. I also discussed with the patient that there may be a patient responsible charge related to this service. The patient expressed understanding and agreed to proceed.  Pt location: Home Physician Location: office Name of referring provider:  Junie Spencer, FNP I connected with Ann Anderson [facility] at patients initiation/request on 09/26/2021 at  8:50 AM EST by video enabled telemedicine application and verified that I am speaking with the correct person using two identifiers. Pt MRN:  [id number] Pt DOB:  Mar 28, 1976 Video Participants:  [facility]  Assessment and Plan:   1.  Migraine without aura, without status migrainosus, not intractable 2.  Recurrent episodes of left-sided numbness and weakness.  Given the habitual semiology of symptoms over the past 5 years, as well as no focal arterial stenosis to explain such symptoms, suspicion for TIAs is low.  Consider hemiplegic migraine, which would be diagnosis of exclusion.  She reports no history of headaches.  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  Migraine prevention:  Topiramate 50mg  at bedtime Migraine rescue:  Tylenol Limit use of pain relievers to no more than 2 days out of week to prevent risk of rebound or medication-overuse headache. Keep headache diary Follow up one year   History of Present Illness:  Nabilah Davoli is a 45 year old Caucasian woman with hypertension and hyperlipidemia and  anxiety who follows up for stroke-like episodes and lower extremity numbness and tingling.   UPDATE: She had a migraine last week which was the first since the summer.  She took Tylenol 1000mg  and resolved in 90 minutes.  She lost 30 pounds since starting topiramate but has leveled off.      Current NSAIDs/analgesics:  ibuprofen Current muscle relaxant:  Flexeril Current antihypertensive:  HCTZ Current antiepileptic:  topiramate 50mg  at bedtime Other medication:  Plavix; atorvastatin 20mg , Requip   HISTORY:  She started having habitual episodes of left sided numbness and weakness in 2015.  She has had a total of 5 episodes.  MRIs have always been negative for stroke.  In 2018, she had associated cognitive and speech difficulty.  She was discharged on Plavix.  Over the past year, she was started on Lipitor 20mg  and HCTZ.   She was admitted to [facility] in early June 2020 for another habitual episode presenting as acute onset left sided numbness of face, arm and leg as well as left arm and leg weakness. She also experienced anxiety attack with chest pain. When symptoms did not resolve after 2 days, she presented to the hospital for further evaluation.  CT of head showed on acute abnormality.  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  Echocardiogram showed EF 63% with no significant valvular abnormalities.  EKG demonstrated normal sinus rhythm.  Cardiac workup otherwise negative as well.  UA and urine drug screen were negative.  Hgb A1c was 5.5.  She was continued on Plavix and Lipitor as well as HCTZ.  She reports slight left occipital headache associated with this last episode.  Otherwise, she denies any  history of headaches.  Episodes usually last several hours.  Following this past episode, she still reports mild residual left upper and lower extremity weakness.  LDL in 2019 was 39.1.  Since that hospitalization in June, she reports increased migraines.  They are severe squeezing pain in the temples, back of head and neck with associated nausea.  They typically last a couple of hours to all day.  They occur 2 (sometimes 3) days a week.  She takes Tylenol 500mg .  Advil upsets her stomach.   She also reports history of lower extremity numbness with burning and weakness causing several falls.  She has chronic back pain with lumbar degenerative disease and sciatica.  Symptoms fluctuate and sometimes requires use of a cane.  She has been followed by neurologists since 2017.  In 2017, B12 was 442, TSH was 2.55 and Hgb A1c was 5.5.  She was last seen by neurology in 2018 in August 2019.  Blood work revealed B12 404, TSH 3.32, ANA negative, RF <14, Sed Rate 16, SPEP/IFE negative for monoclonal gammopathy/M-spike.  She was to undergo NCV-EMG but could not be performed as she was unable to tolerate the pain.  She did have an MRI of the lumbar spine without contrast on 05/15/2019 which showed minimal multilevel disc desiccation and disc bulge at L4-L5 but no significant central or neural foraminal stenosis.   Past antidepressant:  Lexapro    Past Medical History: Past Medical History:  Diagnosis Date   Degenerative disc disease, lumbar    Hyperlipidemia    Hypertension    Neuropathy     Medications: Outpatient Encounter Medications as of 09/26/2021  Medication Sig   oxybutynin (DITROPAN XL) 10 MG 24 hr tablet Take 1 tablet (10 mg total) by mouth at bedtime.   atorvastatin (LIPITOR) 20 MG tablet TAKE ONE TABLET BY MOUTH DAILY   clopidogrel (PLAVIX) 75 MG tablet Take 1 tablet (75 mg total) by mouth daily.   cyclobenzaprine (FLEXERIL) 10 MG tablet Take 1 tablet (10 mg total) by mouth daily.    hydrochlorothiazide (HYDRODIURIL) 12.5 MG tablet TAKE ONE TABLET BY MOUTH DAILY   ibuprofen (ADVIL,MOTRIN) 800 MG tablet Take by mouth as needed.    Multiple Vitamin (MULTIVITAMIN) tablet Take 1 tablet by mouth daily.   topiramate (TOPAMAX) 50 MG tablet TAKE ONE TABLET BY MOUTH AT BEDTIME   vitamin E 100 UNIT capsule Take 100 Units by mouth daily.   No facility-administered encounter medications on file as of 09/26/2021.    Allergies: Allergies  Allergen Reactions   Hydralazine Hives   Nitrofurantoin Hives    Family History: Family History  Problem Relation Age of Onset   Cancer Mother        breast   Heart disease Father    Dementia Father     Observations/Objective:   *** No acute distress.  Alert and oriented.  Speech fluent and not dysarthric.  Language intact.  Eyes orthophoric on primary gaze.  Face symmetric.   Follow Up Instructions:    -I discussed the assessment and treatment plan with the patient. The patient was provided an opportunity to ask questions and all were answered. The patient agreed with the plan and demonstrated an understanding of the instructions.   The patient  was advised to call back or seek an in-person evaluation if the symptoms worsen or if the condition fails to improve as anticipated.  Cira Servant, DO

## 2021-09-26 ENCOUNTER — Ambulatory Visit: Payer: Managed Care, Other (non HMO) | Admitting: Neurology

## 2021-10-02 ENCOUNTER — Ambulatory Visit (INDEPENDENT_AMBULATORY_CARE_PROVIDER_SITE_OTHER): Payer: Managed Care, Other (non HMO) | Admitting: Family Medicine

## 2021-10-02 ENCOUNTER — Encounter: Payer: Self-pay | Admitting: Family Medicine

## 2021-10-02 VITALS — BP 132/78 | HR 51 | Temp 98.1°F | Ht 65.0 in | Wt 177.6 lb

## 2021-10-02 DIAGNOSIS — S8011XA Contusion of right lower leg, initial encounter: Secondary | ICD-10-CM | POA: Diagnosis not present

## 2021-10-02 NOTE — Progress Notes (Signed)
Subjective:  Patient ID: Ann Anderson [facility], female    DOB: 07/02/1976, 45 y.o.   MRN: [id number]  Patient Care Team: Junie Spencer, FNP as PCP - General (Family Medicine) Drema Dallas, DO as Consulting Physician (Neurology)   Chief Complaint:  Mass (Right shin)   HPI: Ann Anderson is a 45 y.o. female presenting on 10/02/2021 for Mass (Right shin)   Patient presents today for evaluation of swelling to right anterior shin.  She states she had her leg about 3 to 4 weeks ago.  States she developed swelling would go away but it has not now she has noticed some bruising.  No tenderness or erythema.  No calf pain or swelling.  No shortness of breath, palpitations, chest pain, dizziness, syncope, or weakness.  She has not tried anything for the symptoms.  She was just concerned and wanted area evaluated.   Relevant past medical, surgical, family, and social history reviewed and updated as indicated.  Allergies and medications reviewed and updated. Data reviewed: Chart in Epic.   Past Medical History:  Diagnosis Date   Degenerative disc disease, lumbar    Hyperlipidemia    Hypertension    Neuropathy     Past Surgical History:  Procedure Laterality Date   TUBAL LIGATION  2005    Social History   Socioeconomic History   Marital status: Married    Spouse name: Not on file   Number of children: 1   Years of education: Not on file   Highest education level: Not on file  Occupational History   Occupation: [employer]  Tobacco Use   Smoking status: Every Day    Packs/day: 1.00    Years: 6.00    Pack years: 6.00    Types: Cigarettes   Smokeless tobacco: Never   Tobacco comments:    Restarted 2 years ago and trying to cut back.   Vaping Use   Vaping Use: Never used  Substance and Sexual Activity   Alcohol use: Yes    Comment: Social   Drug use: Never   Sexual activity: Not Currently    Birth control/protection: Surgical  Other Topics Concern    Not on file  Social History Narrative   Right handed   Lives in a single story home with daughter and cousins   Works at [employer]   Caffeine - trying to cut back now mountain dew 2 cans/day; coffee 6-18 oz   Assoc. Degree   Exercise at work - walks   Social Determinants of Corporate investment banker Strain: Not on file  Food Insecurity: Not on file  Transportation Needs: Not on file  Physical Activity: Not on file  Stress: Not on file  Social Connections: Not on file  Intimate Partner Violence: Not on file    Outpatient Encounter Medications as of 10/02/2021  Medication Sig   atorvastatin (LIPITOR) 20 MG tablet TAKE ONE TABLET BY MOUTH DAILY   clopidogrel (PLAVIX) 75 MG tablet Take 1 tablet (75 mg total) by mouth daily.   cyclobenzaprine (FLEXERIL) 10 MG tablet Take 1 tablet (10 mg total) by mouth daily.   hydrochlorothiazide (HYDRODIURIL) 12.5 MG tablet TAKE ONE TABLET BY MOUTH DAILY   ibuprofen (ADVIL,MOTRIN) 800 MG tablet Take by mouth as needed.    Multiple Vitamin (MULTIVITAMIN) tablet Take 1 tablet by mouth daily.   oxybutynin (DITROPAN XL) 10 MG 24 hr tablet Take 1 tablet (10 mg total) by mouth at bedtime.  topiramate (TOPAMAX) 50 MG tablet TAKE ONE TABLET BY MOUTH AT BEDTIME   vitamin E 100 UNIT capsule Take 100 Units by mouth daily.   [DISCONTINUED] atorvastatin (LIPITOR) 20 MG tablet Take 1 tablet by mouth daily.   [DISCONTINUED] clopidogrel (PLAVIX) 75 MG tablet Take 1 tablet by mouth daily.   No facility-administered encounter medications on file as of 10/02/2021.    Allergies  Allergen Reactions   Hydralazine Hives   Nitrofurantoin Hives    Review of Systems  Constitutional:  Negative for activity change, appetite change, chills, diaphoresis, fatigue, fever and unexpected weight change.  HENT: Negative.    Eyes: Negative.   Respiratory:  Negative for cough, chest tightness, shortness of breath and wheezing.   Cardiovascular:  Negative for chest pain,  palpitations and leg swelling.  Gastrointestinal:  Negative for abdominal pain, blood in stool, constipation, diarrhea, nausea and vomiting.  Endocrine: Negative.   Genitourinary:  Negative for dysuria, frequency and urgency.  Musculoskeletal:  Negative for arthralgias and myalgias.  Skin:  Positive for color change. Negative for pallor, rash and wound.  Allergic/Immunologic: Negative.   Neurological:  Negative for dizziness, tremors, seizures, syncope, facial asymmetry, speech difficulty, weakness, light-headedness, numbness and headaches.  Hematological: Negative.   Psychiatric/Behavioral:  Negative for confusion, hallucinations, sleep disturbance and suicidal ideas.   All other systems reviewed and are negative.      Objective:  BP 132/78   Pulse (!) 51   Temp 98.1 F (36.7 C)   Ht 5\' 5"  (1.651 m)   Wt 177 lb 9.6 oz (80.6 kg)   SpO2 99%   BMI 29.55 kg/m    Wt Readings from Last 3 Encounters:  10/02/21 177 lb 9.6 oz (80.6 kg)  05/13/21 173 lb (78.5 kg)  02/27/21 170 lb (77.1 kg)    Physical Exam Vitals and nursing note reviewed.  Constitutional:      General: She is not in acute distress.    Appearance: Normal appearance. She is well-developed, well-groomed and overweight. She is not ill-appearing, toxic-appearing or diaphoretic.  HENT:     Head: Normocephalic and atraumatic.     Jaw: There is normal jaw occlusion.     Right Ear: Hearing normal.     Left Ear: Hearing normal.     Nose: Nose normal.     Mouth/Throat:     Lips: Pink.     Mouth: Mucous membranes are moist.     Pharynx: Oropharynx is clear. Uvula midline.  Eyes:     General: Lids are normal.     Extraocular Movements: Extraocular movements intact.     Conjunctiva/sclera: Conjunctivae normal.     Pupils: Pupils are equal, round, and reactive to light.  Neck:     Thyroid: No thyroid mass, thyromegaly or thyroid tenderness.     Vascular: No carotid bruit or JVD.     Trachea: Trachea and phonation  normal.  Cardiovascular:     Rate and Rhythm: Normal rate and regular rhythm.     Chest Wall: PMI is not displaced.     Pulses: Normal pulses.     Heart sounds: Normal heart sounds. No murmur heard.   No friction rub. No gallop.  Pulmonary:     Effort: Pulmonary effort is normal. No respiratory distress.     Breath sounds: Normal breath sounds. No wheezing.  Abdominal:     General: There is no abdominal bruit.     Palpations: There is no hepatomegaly or splenomegaly.  Musculoskeletal:  General: Normal range of motion.     Cervical back: Normal range of motion and neck supple.     Right knee: Normal.     Right lower leg: Swelling present. No deformity, lacerations, tenderness or bony tenderness. No edema.     Left lower leg: No edema.     Right ankle: Normal.       Legs:  Lymphadenopathy:     Cervical: No cervical adenopathy.  Skin:    General: Skin is warm and dry.     Capillary Refill: Capillary refill takes less than 2 seconds.     Coloration: Skin is not cyanotic, jaundiced or pale.     Findings: Ecchymosis present. No rash.       Neurological:     General: No focal deficit present.     Mental Status: She is alert and oriented to person, place, and time.     Sensory: Sensation is intact.     Motor: Motor function is intact.     Coordination: Coordination is intact.     Gait: Gait is intact.     Deep Tendon Reflexes: Reflexes are normal and symmetric.  Psychiatric:        Attention and Perception: Attention and perception normal.        Mood and Affect: Mood and affect normal.        Speech: Speech normal.        Behavior: Behavior normal. Behavior is cooperative.        Thought Content: Thought content normal.        Cognition and Memory: Cognition and memory normal.        Judgment: Judgment normal.    Results for orders placed or performed in visit on 05/13/21  Microscopic Examination   Urine  Result Value Ref Range   WBC, UA None seen 0 - 5 /hpf   RBC  0-2 0 - 2 /hpf   Epithelial Cells (non renal) 0-10 0 - 10 /hpf   Bacteria, UA Few (A) None seen/Few  Urinalysis, Routine w reflex microscopic  Result Value Ref Range   Specific Gravity, UA 1.025 1.005 - 1.030   pH, UA 6.0 5.0 - 7.5   Color, UA Yellow Yellow   Appearance Ur Clear Clear   Leukocytes,UA Negative Negative   Protein,UA Negative Negative/Trace   Glucose, UA Negative Negative   Ketones, UA Negative Negative   RBC, UA 1+ (A) Negative   Bilirubin, UA Negative Negative   Urobilinogen, Ur 0.2 0.2 - 1.0 mg/dL   Nitrite, UA Negative Negative   Microscopic Examination See below:        Pertinent labs & imaging results that were available during my care of the patient were reviewed by me and considered in my medical decision making.  Assessment & Plan:  [place] was seen today for mass.  Diagnoses and all orders for this visit:  Contusion of right lower leg, initial encounter Contusion to right anterior shin.  No red flags concerning for DVT.  Reassurance provided.  Symptomatic care discussed in detail.  Patient aware to report any new or worsening symptoms.    Continue all other maintenance medications.  Follow up plan: Return if symptoms worsen or fail to improve.   Continue healthy lifestyle choices, including diet (rich in fruits, vegetables, and lean proteins, and low in salt and simple carbohydrates) and exercise (at least 30 minutes of moderate physical activity daily).  Educational handout given for contusion  The above assessment and management  plan was discussed with the patient. The patient verbalized understanding of and has agreed to the management plan. Patient is aware to call the clinic if they develop any new symptoms or if symptoms persist or worsen. Patient is aware when to return to the clinic for a follow-up visit. Patient educated on when it is appropriate to go to the emergency department.   Kari Baars, FNP-C [facility] [phone]

## 2021-10-03 ENCOUNTER — Other Ambulatory Visit: Payer: Self-pay | Admitting: Nurse Practitioner

## 2021-10-08 ENCOUNTER — Encounter: Payer: Self-pay | Admitting: Family

## 2021-10-08 ENCOUNTER — Ambulatory Visit (INDEPENDENT_AMBULATORY_CARE_PROVIDER_SITE_OTHER): Payer: Managed Care, Other (non HMO) | Admitting: Family

## 2021-10-08 VITALS — BP 120/67 | HR 74 | Temp 97.5°F | Ht 65.0 in | Wt 177.4 lb

## 2021-10-08 DIAGNOSIS — R42 Dizziness and giddiness: Secondary | ICD-10-CM

## 2021-10-08 DIAGNOSIS — N3281 Overactive bladder: Secondary | ICD-10-CM | POA: Diagnosis not present

## 2021-10-08 DIAGNOSIS — Z8673 Personal history of transient ischemic attack (TIA), and cerebral infarction without residual deficits: Secondary | ICD-10-CM | POA: Diagnosis not present

## 2021-10-08 DIAGNOSIS — Z720 Tobacco use: Secondary | ICD-10-CM

## 2021-10-08 DIAGNOSIS — I10 Essential (primary) hypertension: Secondary | ICD-10-CM

## 2021-10-08 DIAGNOSIS — E785 Hyperlipidemia, unspecified: Secondary | ICD-10-CM

## 2021-10-08 DIAGNOSIS — G8929 Other chronic pain: Secondary | ICD-10-CM

## 2021-10-08 DIAGNOSIS — M47816 Spondylosis without myelopathy or radiculopathy, lumbar region: Secondary | ICD-10-CM

## 2021-10-08 DIAGNOSIS — M5440 Lumbago with sciatica, unspecified side: Secondary | ICD-10-CM | POA: Diagnosis not present

## 2021-10-08 DIAGNOSIS — F331 Major depressive disorder, recurrent, moderate: Secondary | ICD-10-CM

## 2021-10-08 MED ORDER — CLOPIDOGREL BISULFATE 75 MG PO TABS: 75.0000 mg | ORAL_TABLET | Freq: Every day | ORAL | 1 refills | Status: DC

## 2021-10-08 MED ORDER — CYCLOBENZAPRINE HCL 10 MG PO TABS: 10.0000 mg | ORAL_TABLET | Freq: Every day | ORAL | 2 refills | Status: DC

## 2021-10-08 MED ORDER — OXYBUTYNIN CHLORIDE ER 10 MG PO TB24: 10.0000 mg | ORAL_TABLET | Freq: Every day | ORAL | 1 refills | Status: DC

## 2021-10-08 NOTE — Addendum Note (Signed)
Addended by: [name] A on: 10/08/2021 12:17 PM   Modules accepted: Orders

## 2021-10-08 NOTE — Patient Instructions (Signed)
Dizziness Dizziness is a common problem. It is a feeling of unsteadiness or light-headedness. You may feel like you are about to faint. Dizziness can lead to injury if you stumble or fall. Anyone can become dizzy, but dizziness is more common in older adults. This condition can be caused by a number of things, including medicines, dehydration, or illness. Follow these instructions at home: Eating and drinking  Drink enough fluid to keep your urine pale yellow. This helps to keep you from becoming dehydrated. Try to drink more clear fluids, such as water. Do not drink alcohol. Limit your caffeine intake if told to do so by your health care provider. Check ingredients and nutrition facts to see if a food or beverage contains caffeine. Limit your salt (sodium) intake if told to do so by your health care provider. Check ingredients and nutrition facts to see if a food or beverage contains sodium. Activity  Avoid making quick movements. Rise slowly from chairs and steady yourself until you feel okay. In the morning, first sit up on the side of the bed. When you feel okay, stand slowly while you hold onto something until you know that your balance is good. If you need to stand in one place for a long time, move your legs often. Tighten and relax the muscles in your legs while you are standing. Do not drive or use machinery if you feel dizzy. Avoid bending down if you feel dizzy. Place items in your home so that they are easy for you to reach without leaning over. Lifestyle Do not use any products that contain nicotine or tobacco. These products include cigarettes, chewing tobacco, and vaping devices, such as e-cigarettes. If you need help quitting, ask your health care provider. Try to reduce your stress level by using methods such as yoga or meditation. Talk with your health care provider if you need help to manage your stress. General instructions Watch your dizziness for any changes. Take  over-the-counter and prescription medicines only as told by your health care provider. Talk with your health care provider if you think that your dizziness is caused by a medicine that you are taking. Tell a friend or a family member that you are feeling dizzy. If he or she notices any changes in your behavior, have this person call your health care provider. Keep all follow-up visits. This is important. Contact a health care provider if: Your dizziness does not go away or you have new symptoms. Your dizziness or light-headedness gets worse. You feel nauseous. You have reduced hearing. You have a fever. You have neck pain or a stiff neck. Your dizziness leads to an injury or a fall. Get help right away if: You vomit or have diarrhea and are unable to eat or drink anything. You have problems talking, walking, swallowing, or using your arms, hands, or legs. You feel generally weak. You have any bleeding. You are not thinking clearly or you have trouble forming sentences. It may take a friend or family member to notice this. You have chest pain, abdominal pain, shortness of breath, or sweating. Your vision changes or you develop a severe headache. These symptoms may represent a serious problem that is an emergency. Do not wait to see if the symptoms will go away. Get medical help right away. Call your local emergency services (911 in the [place] Do not drive yourself to the hospital. Summary Dizziness is a feeling of unsteadiness or light-headedness. This condition can be caused by a number of   things, including medicines, dehydration, or illness. Anyone can become dizzy, but dizziness is more common in older adults. Drink enough fluid to keep your urine pale yellow. Do not drink alcohol. Avoid making quick movements if you feel dizzy. Monitor your dizziness for any changes. This information is not intended to replace advice given to you by your health care provider. Make sure you discuss any  questions you have with your health care provider. Document Revised: 09/16/2020 Document Reviewed: 09/16/2020 Elsevier Patient Education  2022 [employer]  

## 2021-10-08 NOTE — Progress Notes (Addendum)
Subjective:    Patient ID: Ann Anderson [facility], female    DOB: 1976-05-07, 45 y.o.   MRN: [id number]  Chief Complaint  Patient presents with   Headache    Its better now    Dizziness   Medical Management of Chronic Issues   Chief Complaint  Patient presents with   Headache    Its better now    Dizziness   Medical Management of Chronic Issues   PT presents to the office today for chronic follow up. She is followed by Neurologists annually for migraines and TIA's.  Headache  This is a chronic problem. The current episode started more than 1 year ago. Episode frequency: every 6 months. The pain quality is similar to prior headaches. The pain is moderate. Associated symptoms include dizziness. Pertinent negatives include no nausea. She has tried beta blockers for the symptoms. The treatment provided moderate relief. Her past medical history is significant for hypertension and obesity.  Dizziness This is a new problem. The current episode started more than 1 month ago. The problem occurs intermittently. Associated symptoms include headaches. Pertinent negatives include no nausea.  Hyperlipidemia This is a chronic problem. The current episode started more than 1 year ago. Exacerbating diseases include obesity. Pertinent negatives include no shortness of breath. Current antihyperlipidemic treatment includes statins. The current treatment provides moderate improvement of lipids. Risk factors for coronary artery disease include dyslipidemia, hypertension and a sedentary lifestyle.  Hypertension This is a chronic problem. The current episode started in the past 7 days. The problem has been resolved since onset. Associated symptoms include headaches. Pertinent negatives include no malaise/fatigue, peripheral edema or shortness of breath. Risk factors for coronary artery disease include dyslipidemia, obesity and smoking/tobacco exposure. The current treatment provides moderate improvement.   Arthritis Presents for follow-up visit. She complains of pain and stiffness. The symptoms have been stable. Affected locations include the left ankle, right ankle, right knee and left knee. Her pain is at a severity of 5/10.  Nicotine Dependence Presents for follow-up visit. Her urge triggers include company of smokers. The symptoms have been stable. She smokes < 1/2 a pack of cigarettes per day.  Urinary Frequency  This is a chronic problem. The current episode started more than 1 year ago. Associated symptoms include frequency. Pertinent negatives include no hematuria, nausea or urgency. Treatments tried: oxybutynin.  Depression        This is a chronic problem.  The current episode started more than 1 year ago.   The onset quality is gradual.   The problem has been resolved since onset.  Associated symptoms include headaches.  Associated symptoms include no helplessness, no hopelessness, no restlessness and not sad.    Review of Systems  Constitutional:  Negative for malaise/fatigue.  Respiratory:  Negative for shortness of breath.   Gastrointestinal:  Negative for nausea.  Genitourinary:  Positive for frequency. Negative for hematuria and urgency.  Musculoskeletal:  Positive for arthritis and stiffness.  Neurological:  Positive for dizziness and headaches.  Psychiatric/Behavioral:  Positive for depression.   All other systems reviewed and are negative.     Objective:   Physical Exam Vitals reviewed.  Constitutional:      General: She is not in acute distress.    Appearance: She is well-developed. She is obese.  HENT:     Head: Normocephalic and atraumatic.     Right Ear: Tympanic membrane normal.     Left Ear: Tympanic membrane normal.  Eyes:  Pupils: Pupils are equal, round, and reactive to light.  Neck:     Thyroid: No thyromegaly.  Cardiovascular:     Rate and Rhythm: Normal rate and regular rhythm.     Heart sounds: Normal heart sounds. No murmur heard. Pulmonary:      Effort: Pulmonary effort is normal. No respiratory distress.     Breath sounds: Normal breath sounds. No wheezing.  Abdominal:     General: Bowel sounds are normal. There is no distension.     Palpations: Abdomen is soft.     Tenderness: There is no abdominal tenderness.  Musculoskeletal:        General: No tenderness. Normal range of motion.     Cervical back: Normal range of motion and neck supple.  Skin:    General: Skin is warm and dry.  Neurological:     Mental Status: She is alert and oriented to person, place, and time.     Cranial Nerves: No cranial nerve deficit.     Deep Tendon Reflexes: Reflexes are normal and symmetric.  Psychiatric:        Behavior: Behavior normal.        Thought Content: Thought content normal.        Judgment: Judgment normal.         BP 120/67    Pulse 74    Temp (!) 97.5 F (36.4 C) (Temporal)    Ht _0  (1.651 m)    Wt 177 lb 6.4 oz (80.5 kg)    BMI 29.52 kg/m   Assessment & Plan:  Ann Anderson [facility] comes in today with chief complaint of Headache (Its better now ), Dizziness, and Medical Management of Chronic Issues   Diagnosis and orders addressed:  1. History of TIAs - clopidogrel (PLAVIX) 75 MG tablet; Take 1 tablet (75 mg total) by mouth daily.  Dispense: 90 tablet; Refill: 1 - CMP14+EGFR - CBC with Differential/Platelet  2. Chronic low back pain with sciatica, sciatica laterality unspecified, unspecified back pain laterality - cyclobenzaprine (FLEXERIL) 10 MG tablet; Take 1 tablet (10 mg total) by mouth daily.  Dispense: 60 tablet; Refill: 2 - CMP14+EGFR - CBC with Differential/Platelet  3. Hyperlipidemia, unspecified hyperlipidemia type - CMP14+EGFR - CBC with Differential/Platelet  4. Essential hypertension - CMP14+EGFR - CBC with Differential/Platelet  5. Overactive bladder - oxybutynin (DITROPAN XL) 10 MG 24 hr tablet; Take 1 tablet (10 mg total) by mouth at bedtime.  Dispense: 90 tablet; Refill: 1 -  CMP14+EGFR - CBC with Differential/Platelet  6. Arthritis, lumbar spine  7. Tobacco abuse  8. MDD (major depressive disorder), recurrent episode, moderate ([place])  9. Dizziness - TSH  Labs pending Health Maintenance reviewed Diet and exercise encouraged  Follow up plan: 6 months    [name], FNP

## 2021-10-09 LAB — CMP14+EGFR
ALT: 18 IU/L (ref 0–32)
AST: 16 IU/L (ref 0–40)
Albumin/Globulin Ratio: 1.7 (ref 1.2–2.2)
Albumin: 4.1 g/dL (ref 3.8–4.8)
Alkaline Phosphatase: 62 IU/L (ref 44–121)
BUN/Creatinine Ratio: 15 (ref 9–23)
BUN: 12 mg/dL (ref 6–24)
Bilirubin Total: 0.3 mg/dL (ref 0.0–1.2)
CO2: 22 mmol/L (ref 20–29)
Calcium: 9.2 mg/dL (ref 8.7–10.2)
Chloride: 104 mmol/L (ref 96–106)
Creatinine, Ser: 0.8 mg/dL (ref 0.57–1.00)
Globulin, Total: 2.4 g/dL (ref 1.5–4.5)
Glucose: 94 mg/dL (ref 70–99)
Potassium: 3.9 mmol/L (ref 3.5–5.2)
Sodium: 140 mmol/L (ref 134–144)
Total Protein: 6.5 g/dL (ref 6.0–8.5)
eGFR: 93 mL/min/{1.73_m2} (ref >59)

## 2021-10-09 LAB — CBC WITH DIFFERENTIAL/PLATELET
Basophils Absolute: 0.1 10*3/uL (ref 0.0–0.2)
Basos: 1 %
EOS (ABSOLUTE): 0.3 10*3/uL (ref 0.0–0.4)
Eos: 3 %
Hematocrit: 41.9 % (ref 34.0–46.6)
Hemoglobin: 13.6 g/dL (ref 11.1–15.9)
Immature Grans (Abs): 0 10*3/uL (ref 0.0–0.1)
Immature Granulocytes: 0 %
Lymphocytes Absolute: 2.5 10*3/uL (ref 0.7–3.1)
Lymphs: 28 %
MCH: 28 pg (ref 26.6–33.0)
MCHC: 32.5 g/dL (ref 31.5–35.7)
MCV: 86 fL (ref 79–97)
Monocytes Absolute: 0.6 10*3/uL (ref 0.1–0.9)
Monocytes: 7 %
Neutrophils Absolute: 5.3 10*3/uL (ref 1.4–7.0)
Neutrophils: 61 %
Platelets: 294 10*3/uL (ref 150–450)
RBC: 4.85 x10E6/uL (ref 3.77–5.28)
RDW: 13.1 % (ref 11.7–15.4)
WBC: 8.8 10*3/uL (ref 3.4–10.8)

## 2021-10-09 LAB — TSH: TSH: 2.45 u[IU]/mL (ref 0.450–4.500)

## 2021-10-23 ENCOUNTER — Telehealth: Payer: Self-pay

## 2021-10-23 ENCOUNTER — Other Ambulatory Visit: Payer: Self-pay

## 2021-10-23 DIAGNOSIS — Z1231 Encounter for screening mammogram for malignant neoplasm of breast: Secondary | ICD-10-CM

## 2021-10-23 NOTE — Telephone Encounter (Signed)
Called patient to r/s mammogram appointment with mobile unit - LMTCB

## 2021-12-31 ENCOUNTER — Ambulatory Visit (INDEPENDENT_AMBULATORY_CARE_PROVIDER_SITE_OTHER): Payer: Managed Care, Other (non HMO) | Admitting: Family

## 2021-12-31 ENCOUNTER — Encounter: Payer: Self-pay | Admitting: Family

## 2021-12-31 VITALS — BP 135/83 | HR 72 | Temp 98.3°F | Ht 65.0 in | Wt 177.4 lb

## 2021-12-31 DIAGNOSIS — F17200 Nicotine dependence, unspecified, uncomplicated: Secondary | ICD-10-CM

## 2021-12-31 DIAGNOSIS — N921 Excessive and frequent menstruation with irregular cycle: Secondary | ICD-10-CM

## 2021-12-31 DIAGNOSIS — R42 Dizziness and giddiness: Secondary | ICD-10-CM | POA: Diagnosis not present

## 2021-12-31 DIAGNOSIS — N926 Irregular menstruation, unspecified: Secondary | ICD-10-CM | POA: Diagnosis not present

## 2021-12-31 DIAGNOSIS — R531 Weakness: Secondary | ICD-10-CM

## 2021-12-31 NOTE — Progress Notes (Signed)
? ?Subjective:  ? ? Patient ID: Ann Anderson [facility], female    DOB: May 17, 1976, 46 y.o.   MRN: [id number] ? ?Chief Complaint  ?Patient presents with  ? Menstrual Problem  ? left side weakness   ?  Headache dizzy 3 weeks.  ? ?PT presents to the office today with complaints of menstrual problems. She reports since December, January, and February and has had two menstrual cycle every 2 weeks. She reports 5-6 days of bleeding and using tampons and pads every hour for day 2 and 3.  ? ?She reports she has noticed mild left sided weakness. She has a constant numb feeling in her left leg.  ?Dizziness ?This is a new problem. The current episode started in the past 7 days. The problem occurs intermittently.  ?Nicotine Dependence ?Presents for follow-up visit. Her urge triggers include company of smokers. The symptoms have been stable.  ? ? ? ?Review of Systems  ?Neurological:  Positive for dizziness.  ?All other systems reviewed and are negative. ? ?   ?Objective:  ? Physical Exam ?Vitals reviewed.  ?Constitutional:   ?   General: She is not in acute distress. ?   Appearance: She is well-developed. She is obese.  ?HENT:  ?   Head: Normocephalic and atraumatic.  ?   Right Ear: Tympanic membrane normal.  ?   Left Ear: Tympanic membrane normal.  ?Eyes:  ?   Pupils: Pupils are equal, round, and reactive to light.  ?Neck:  ?   Thyroid: No thyromegaly.  ?Cardiovascular:  ?   Rate and Rhythm: Normal rate and regular rhythm.  ?   Heart sounds: Normal heart sounds. No murmur heard. ?Pulmonary:  ?   Effort: Pulmonary effort is normal. No respiratory distress.  ?   Breath sounds: Normal breath sounds. No wheezing.  ?Abdominal:  ?   General: Bowel sounds are normal. There is no distension.  ?   Palpations: Abdomen is soft.  ?   Tenderness: There is no abdominal tenderness.  ?Musculoskeletal:     ?   General: No tenderness. Normal range of motion.  ?   Cervical back: Normal range of motion and neck supple.  ?Skin: ?   General: Skin is  warm and dry.  ?Neurological:  ?   Mental Status: She is alert and oriented to person, place, and time.  ?   Cranial Nerves: No cranial nerve deficit.  ?   Deep Tendon Reflexes: Reflexes are normal and symmetric.  ?Psychiatric:     ?   Behavior: Behavior normal.     ?   Thought Content: Thought content normal.     ?   Judgment: Judgment normal.  ? ? ?BP 135/83   Pulse 72   Temp 98.3 ?F (36.8 ?C) (Temporal)   Ht '5\' 5"'  (1.651 m)   Wt 177 lb 6.4 oz (80.5 kg)   LMP 12/19/2021   BMI 29.52 kg/m?  ? ? ? ?   ?Assessment & Plan:  ?[facility] comes in today with chief complaint of Menstrual Problem and left side weakness  (Headache dizzy 3 weeks.) ? ? ?Diagnosis and orders addressed: ? ?1. Menstruation, irregular ?- US Pelvic Complete With Transvaginal; Future ?- Anemia Profile B ?- CMP14+EGFR ? ?2. Dizziness ?- Anemia Profile B ?- CMP14+EGFR ? ?3. Current smoker ?- CMP14+EGFR ? ?4. Menorrhagia with irregular cycle ?- US Pelvic Complete With Transvaginal; Future ?- Anemia Profile B ?- CMP14+EGFR ? ?5. Weakness ?- Anemia Profile B ? ? ?Labs  pending- Rule out low iron  ?[place] pending to rule out fibroid. Could be related to pre-menopause?  ?Smoking cessation discussed  ?Health Maintenance reviewed ?Diet and exercise encouraged ? ?Follow up plan: ?3 months  ? ? ?[name], FNP ? ? ? ?

## 2021-12-31 NOTE — Patient Instructions (Signed)
Menorrhagia Menorrhagia is a form of abnormal uterine bleeding in which menstrual periods are heavy or last longer than normal. With menorrhagia, the periods may cause enough blood loss and cramping that a woman becomes unable to take part in herusual activities. What are the causes? Common causes of this condition include: Polyps or fibroids. These are noncancerous growths in the uterus. An imbalance of the hormones estrogen and progesterone. Anovulation, which occurs when one of the ovaries does not release an egg during one or more months. A problem with the thyroid gland (hypothyroidism). Side effects of having an intrauterine device (IUD). Side effects of some medicines, such as NSAIDs or blood thinners. A bleeding disorder that stops the blood from clotting normally. In some cases, the cause of this condition is not known. What increases the risk? You are more likely to develop this condition if you have cancer of the uterus. What are the signs or symptoms? Symptoms of this condition include: Routinely having to change your pad or tampon every 1-2 hours because it is soaked. Needing to use pads and tampons at the same time because of heavy bleeding. Needing to wake up to change your pads or tampons during the night. Passing blood clots larger than 1 inch (2.5 cm) in size. Having bleeding that lasts for more than 7 days. Having symptoms of low iron levels (anemia), such as tiredness (fatigue) or shortness of breath. How is this diagnosed? This condition may be diagnosed based on: A physical exam. Your symptoms and menstrual history. Tests, such as: Blood tests to check if you are pregnant or if you have hormonal changes, a bleeding or thyroid disorder, anemia, or other problems. Pap test to check for cancerous changes, infections, or inflammation. Endometrial biopsy. This test involves removing a tissue sample from the lining of the uterus (endometrium) to be examined under a  microscope. Pelvic ultrasound. This test uses sound waves to create images of your uterus, ovaries, and vagina. The images can show if you have fibroids or other growths. Hysteroscopy. For this test, a thin, flexible tube with a light on the end (hysteroscope) is used to look inside your uterus. How is this treated? Treatment may not be needed for this condition. If it is needed, the best treatment for you will depend on: Whether you need to prevent pregnancy. Your desire to have children in the future. The cause and severity of your bleeding. Your personal preference. Medicine Medicines are the first step in treatment. You may be treated with: Hormonal birth control methods. These treatments reduce bleeding during your menstrual period. They include: Birth control pills. Skin patch. Vaginal ring. Shots (injections) that you get every 3 months. Hormonal IUD. Implants that go under the skin. Medicines that thicken the blood and slow bleeding. Medicines that reduce swelling, such as ibuprofen. Medicines that contain an artificial (synthetic) hormone called progestin. Medicines that make the ovaries stop working for a short time. Iron supplements to treat anemia.  Surgery If medicines do not work, surgery may be done. Surgical options may include: Dilation and curettage (D&C). In this procedure, your health care provider opens the lowest part of the uterus (cervix) and then scrapes or suctions tissue from the endometrium. This reduces menstrual bleeding. Operative hysteroscopy. In this procedure, a hysteroscope is used to view your uterus and help remove polyps that may be causing heavy periods. Endometrial ablation. This is when various techniques are used to permanently destroy your entire endometrium. After endometrial ablation, most women have little or   no menstrual flow. This procedure reduces your ability to become pregnant. Endometrial resection. In this procedure, an electrosurgical  wire loop is used to remove the endometrium. This procedure reduces your ability to become pregnant. Hysterectomy. This is surgical removal of your uterus. This is a permanent procedure that stops menstrual periods. Pregnancy is not possible after a hysterectomy. Follow these instructions at home: Medicines Take over-the-counter and prescription medicines only as told by your health care provider. This includes iron pills. Do not change or switch medicines without asking your health care provider. Do not take aspirin or medicines that contain aspirin 1 week before or during your menstrual period. Aspirin may make bleeding worse. Managing constipation Your iron pills may cause constipation. If you are taking prescription iron supplements, you may need to take these actions to prevent or treat constipation: Drink enough fluid to keep your urine pale yellow. Take over-the-counter or prescription medicines. Eat foods that are high in fiber, such as beans, whole grains, and fresh fruits and vegetables. Limit foods that are high in fat and processed sugars, such as fried or sweet foods. General instructions If you need to change your sanitary pad or tampon more than once every 2 hours, limit your activity until the bleeding stops. Eat well-balanced meals, including foods that are high in iron. Foods that have a lot of iron include leafy green vegetables, meat, liver, eggs, and whole-grain breads and cereals. Do not try to lose weight until the abnormal bleeding has stopped and your blood iron level is back to normal. If you need to lose weight, work with your health care provider to lose weight safely. Keep all follow-up visits. This is important. Contact a health care provider if: You soak through a pad or tampon every 1 or 2 hours, and this happens every time you have a period. You need to use pads and tampons at the same time because you are bleeding so much. You have nausea, vomiting, diarrhea, or  other problems related to medicines you are taking. Get help right away if: You soak through more than a pad or tampon in 1 hour. You pass clots bigger than 1 inch (2.5 cm) wide. You feel short of breath. You feel like your heart is beating too fast. You feel dizzy or you faint. You feel very weak or tired. Summary Menorrhagia is a form of abnormal uterine bleeding in which menstrual periods are heavy or last longer than normal. Treatment may not be needed for this condition. If it is needed, it may include medicines or procedures. Take over-the-counter and prescription medicines only as told by your health care provider. This includes iron pills. Get help right away if you have heavy bleeding that soaks through more than a pad or tampon in 1 hour, you pass large clots, or you feel dizzy, short of breath, or very weak or tired. This information is not intended to replace advice given to you by your health care provider. Make sure you discuss any questions you have with your healthcare provider. Document Revised: 06/25/2020 Document Reviewed: 06/25/2020 Elsevier Patient Education  2022 [employer] Inc[employer]  

## 2022-01-01 ENCOUNTER — Other Ambulatory Visit: Payer: Self-pay | Admitting: Family

## 2022-01-01 LAB — ANEMIA PROFILE B
Basophils Absolute: 0.1 10*3/uL (ref 0.0–0.2)
Basos: 1 %
EOS (ABSOLUTE): 0.3 10*3/uL (ref 0.0–0.4)
Eos: 4 %
Ferritin: 12 ng/mL — ABNORMAL LOW (ref 15–150)
Folate: 12.9 ng/mL (ref >3.0)
Hematocrit: 37.5 % (ref 34.0–46.6)
Hemoglobin: 12.6 g/dL (ref 11.1–15.9)
Immature Grans (Abs): 0 10*3/uL (ref 0.0–0.1)
Immature Granulocytes: 1 %
Iron Saturation: 16 % (ref 15–55)
Iron: 68 ug/dL (ref 27–159)
Lymphocytes Absolute: 2.3 10*3/uL (ref 0.7–3.1)
Lymphs: 29 %
MCH: 28.2 pg (ref 26.6–33.0)
MCHC: 33.6 g/dL (ref 31.5–35.7)
MCV: 84 fL (ref 79–97)
Monocytes Absolute: 0.7 10*3/uL (ref 0.1–0.9)
Monocytes: 9 %
Neutrophils Absolute: 4.5 10*3/uL (ref 1.4–7.0)
Neutrophils: 56 %
Platelets: 301 10*3/uL (ref 150–450)
RBC: 4.47 x10E6/uL (ref 3.77–5.28)
RDW: 14.2 % (ref 11.7–15.4)
Retic Ct Pct: 1.9 % (ref 0.6–2.6)
Total Iron Binding Capacity: 428 ug/dL (ref 250–450)
UIBC: 360 ug/dL (ref 131–425)
Vitamin B-12: 518 pg/mL (ref 232–1245)
WBC: 7.9 10*3/uL (ref 3.4–10.8)

## 2022-01-01 LAB — CMP14+EGFR
ALT: 24 IU/L (ref 0–32)
AST: 16 IU/L (ref 0–40)
Albumin/Globulin Ratio: 1.8 (ref 1.2–2.2)
Albumin: 4.2 g/dL (ref 3.8–4.8)
Alkaline Phosphatase: 65 IU/L (ref 44–121)
BUN/Creatinine Ratio: 19 (ref 9–23)
BUN: 13 mg/dL (ref 6–24)
Bilirubin Total: 0.4 mg/dL (ref 0.0–1.2)
CO2: 23 mmol/L (ref 20–29)
Calcium: 9.2 mg/dL (ref 8.7–10.2)
Chloride: 105 mmol/L (ref 96–106)
Creatinine, Ser: 0.68 mg/dL (ref 0.57–1.00)
Globulin, Total: 2.4 g/dL (ref 1.5–4.5)
Glucose: 97 mg/dL (ref 70–99)
Potassium: 4.4 mmol/L (ref 3.5–5.2)
Sodium: 141 mmol/L (ref 134–144)
Total Protein: 6.6 g/dL (ref 6.0–8.5)
eGFR: 109 mL/min/{1.73_m2} (ref >59)

## 2022-01-01 MED ORDER — MECLIZINE HCL 25 MG PO TABS: 25.0000 mg | ORAL_TABLET | Freq: Three times a day (TID) | ORAL | 1 refills | Status: DC | PRN

## 2022-01-08 ENCOUNTER — Ambulatory Visit (HOSPITAL_COMMUNITY): Payer: Managed Care, Other (non HMO)

## 2022-01-16 ENCOUNTER — Ambulatory Visit (HOSPITAL_COMMUNITY)
Admission: RE | Admit: 2022-01-16 | Discharge: 2022-01-16 | Disposition: A | Discharge status: Home / Self Care | Payer: Managed Care, Other (non HMO) | Source: Ambulatory Visit | Attending: Family | Admitting: Family

## 2022-01-16 ENCOUNTER — Other Ambulatory Visit: Payer: Self-pay

## 2022-01-16 DIAGNOSIS — N921 Excessive and frequent menstruation with irregular cycle: Secondary | ICD-10-CM | POA: Insufficient documentation

## 2022-01-16 DIAGNOSIS — N926 Irregular menstruation, unspecified: Secondary | ICD-10-CM | POA: Diagnosis present

## 2022-01-20 ENCOUNTER — Other Ambulatory Visit: Payer: Self-pay | Admitting: Family

## 2022-01-20 DIAGNOSIS — Z720 Tobacco use: Secondary | ICD-10-CM

## 2022-01-20 DIAGNOSIS — N939 Abnormal uterine and vaginal bleeding, unspecified: Secondary | ICD-10-CM

## 2022-01-20 DIAGNOSIS — Z8673 Personal history of transient ischemic attack (TIA), and cerebral infarction without residual deficits: Secondary | ICD-10-CM

## 2022-02-23 ENCOUNTER — Ambulatory Visit (INDEPENDENT_AMBULATORY_CARE_PROVIDER_SITE_OTHER): Payer: Managed Care, Other (non HMO) | Admitting: Obstetrics & Gynecology

## 2022-02-23 ENCOUNTER — Other Ambulatory Visit (HOSPITAL_COMMUNITY)
Admission: RE | Admit: 2022-02-23 | Discharge: 2022-02-23 | Disposition: A | Discharge status: Home / Self Care | Payer: Managed Care, Other (non HMO) | Source: Ambulatory Visit | Attending: Obstetrics & Gynecology | Admitting: Obstetrics & Gynecology

## 2022-02-23 ENCOUNTER — Encounter: Payer: Self-pay | Admitting: Obstetrics & Gynecology

## 2022-02-23 VITALS — BP 143/75 | HR 76 | Ht 64.75 in | Wt 184.6 lb

## 2022-02-23 DIAGNOSIS — Z8673 Personal history of transient ischemic attack (TIA), and cerebral infarction without residual deficits: Secondary | ICD-10-CM

## 2022-02-23 DIAGNOSIS — N939 Abnormal uterine and vaginal bleeding, unspecified: Secondary | ICD-10-CM

## 2022-02-23 DIAGNOSIS — Z720 Tobacco use: Secondary | ICD-10-CM | POA: Diagnosis not present

## 2022-02-23 DIAGNOSIS — Z01411 Encounter for gynecological examination (general) (routine) with abnormal findings: Secondary | ICD-10-CM

## 2022-02-23 DIAGNOSIS — I10 Essential (primary) hypertension: Secondary | ICD-10-CM | POA: Diagnosis not present

## 2022-02-23 MED ORDER — NORETHINDRONE 0.35 MG PO TABS: 1.0000 | ORAL_TABLET | Freq: Every day | ORAL | 1 refills | Status: DC

## 2022-02-23 NOTE — Progress Notes (Signed)
? ?WELL-WOMAN EXAMINATION ?Patient name: Ann Anderson MRN [id number]  Date of birth: 04-22-1976 ?Chief Complaint:   ?New Patient (Initial Visit), Gynecologic Exam, and Frequent periods ? ?History of Present Illness:   ?Ann Anderson is a 46 y.o. G3P3 female being seen today for a routine well-woman exam and to address the following concern: ? ?AUB:  This has been an issue since December, periods every 2 wks. ?This past month was not as bad as previously- only lasted for 4-5 days, not as heavy. Typically heavy 2nd-4th day.  Using both pad/tampon together every 1-2hours.  Moderate dysmenorrhea ?No other acute complaints ? ?Records reviewed- Seen by PCP 12/31/21- reports menses every 2wk since December.  Each menses last about 5-6 days.   ? ?[place] completed 01/17/22: 8.8cm uterus, poor definition of endo-myoterial interface.  No fibroids.  Normal ovaries bilaterally ? ?Anemia panel wnl ? ?Contraception: BTL ? ?OCP risk assessment: +tobacco use, h/o TIA, on plavix daily ? ? ?Last pap many years ago.  ?Last mammogram: 2018. ? ? ?  02/23/2022  ?  8:48 AM 12/31/2021  ? 10:59 AM 10/02/2021  ? 10:34 AM 05/13/2021  ? 11:12 AM 02/27/2021  ?  3:11 PM  ?Depression screen PHQ 2/9  ?Decreased Interest 0 1 1 0 0  ?Down, Depressed, Hopeless 1 1 1 1 1   ?PHQ - 2 Score 1 2 2 1 1   ?Altered sleeping 1 1 1  3   ?Tired, decreased energy 1 1 0  1  ?Change in appetite 0 0 0  1  ?Feeling bad or failure about yourself  1 1 1  2   ?Trouble concentrating 1 0 1  0  ?Moving slowly or fidgety/restless 1 0 0  1  ?Suicidal thoughts 0 0 0  0  ?PHQ-9 Score 6 5 5  9   ?Difficult doing work/chores  Not difficult at all Not difficult at all  Not difficult at all  ? ? ? ? ?Review of Systems:   ?Pertinent items are noted in HPI ?Denies any headaches, blurred vision, fatigue, shortness of breath, chest pain, abdominal pain, bowel movements, urination, or intercourse unless otherwise stated above. ? ?Pertinent History Reviewed:  ?Reviewed past  medical,surgical, social and family history.  ?OBhx: NSVD x 3 ?Reviewed problem list, medications and allergies. ?Physical Assessment:  ? ?Vitals:  ? 02/23/22 0841  ?BP: (!) 143/75  ?Pulse: 76  ?Weight: 184 lb 9.6 oz (83.7 kg)  ?Height: 5' 4.75" (1.645 m)  ?Body mass index is 30.96 kg/m?. ?  ?     Physical Examination:  ? General appearance - well appearing, and in no distress ? Mental status - alert, oriented to person, place, and time ? Psych:  She has a normal mood and affect ? Skin - warm and dry, normal color, no suspicious lesions noted ? Chest - effort normal, all lung fields clear to auscultation bilaterally ? Heart - normal rate and regular rhythm ? Neck:  midline trachea, no thyromegaly or nodules ? Breasts - breasts appear normal, no suspicious masses, no skin or nipple changes or  axillary nodes ? Abdomen - soft, nontender, nondistended, no masses or organomegaly ? Pelvic - VULVA: normal appearing vulva with no masses, tenderness or lesions  VAGINA: normal appearing vagina with normal color and discharge, no lesions  CERVIX: normal appearing cervix without discharge or lesions, no CMT ? Thin prep pap is done with HR HPV cotesting ? UTERUS: uterus is felt to be normal size, shape, consistency and nontender  ?  ADNEXA: No adnexal masses or tenderness noted. ? Extremities:  No swelling or varicosities noted ? ?Chaperone:  pt declined    ? ? ?Assessment & Plan:  ?1) Well-Woman Exam ?-pap collected ?-mammogram ordered, plans to schedule ? ?2) AUB ?-discussed concerns for perimenopausal status ?-due to prior medical history she is not candidate for OCPs ?-discussed management options from monitoring bleeding, progesterone only options to surgical intervention such as ablation or hsyterectomy ?-questions/concerns were addressed, she would prefer to avoid surgery if possible ?-for now trial of POPs, f/u in 3 mos ?[]  if no improvement with POPs, will consider EMB- pt aware ? ? ?Meds:  ?Meds ordered this encounter   ?Medications  ? norethindrone (ORTHO MICRONOR) 0.35 MG tablet  ?  Sig: Take 1 tablet (0.35 mg total) by mouth daily.  ?  Dispense:  90 tablet  ?  Refill:  1  ? ? ?Follow-up: Return in about 3 months (around 05/26/2022) for Medication follow up, with Dr. Nelda Marseille. ? ? ?Janyth Pupa, DO ?Attending [place] ?[facility] ? ? ?

## 2022-02-25 LAB — CYTOLOGY - PAP
Comment: NEGATIVE
Diagnosis: NEGATIVE
High risk HPV: NEGATIVE

## 2022-04-19 ENCOUNTER — Other Ambulatory Visit: Payer: Self-pay | Admitting: Family

## 2022-04-19 DIAGNOSIS — N3281 Overactive bladder: Secondary | ICD-10-CM

## 2022-05-11 ENCOUNTER — Encounter: Payer: Self-pay | Admitting: Obstetrics & Gynecology

## 2022-05-11 ENCOUNTER — Ambulatory Visit (INDEPENDENT_AMBULATORY_CARE_PROVIDER_SITE_OTHER): Payer: Managed Care, Other (non HMO) | Admitting: Obstetrics & Gynecology

## 2022-05-11 VITALS — BP 126/73 | HR 73 | Ht 64.75 in | Wt 181.8 lb

## 2022-05-11 DIAGNOSIS — N939 Abnormal uterine and vaginal bleeding, unspecified: Secondary | ICD-10-CM

## 2022-05-11 DIAGNOSIS — Z720 Tobacco use: Secondary | ICD-10-CM | POA: Diagnosis not present

## 2022-05-11 DIAGNOSIS — I10 Essential (primary) hypertension: Secondary | ICD-10-CM | POA: Diagnosis not present

## 2022-05-11 MED ORDER — NORETHINDRONE 0.35 MG PO TABS: 1.0000 | ORAL_TABLET | Freq: Every day | ORAL | 4 refills | Status: DC

## 2022-05-11 NOTE — Progress Notes (Signed)
   GYN VISIT Patient name: Ann Anderson MRN [id number]  Date of birth: 05-18-1976 Chief Complaint:   Follow-up  History of Present Illness:   Ann Anderson is a 46 y.o. G3P3 female being seen today for the following concerns:  AUB: Last visit, she was started on POPs.  Today she reports menses are now every month.  Last month had 2 moderate days and the rest spotting.  Now can wear her pad/tampon every 6 hours.  Overall doing much better and feels this is working for her.  Denies pelvic pain or cramping.  No intermenstrual bleeding.   Previously noted menses every 2 weeks, lasting 5 days soaking through pad/tampon every 1-2hr.  Contraception: BTL  Patient's last menstrual period was 05/09/2022.     02/23/2022    8:48 AM 12/31/2021   10:59 AM 10/02/2021   10:34 AM 05/13/2021   11:12 AM 02/27/2021    3:11 PM  Depression screen PHQ 2/9  Decreased Interest 0 1 1 0 0  Down, Depressed, Hopeless 1 1 1 1 1   PHQ - 2 Score 1 2 2 1 1   Altered sleeping 1 1 1  3   Tired, decreased energy 1 1 0  1  Change in appetite 0 0 0  1  Feeling bad or failure about yourself  1 1 1  2   Trouble concentrating 1 0 1  0  Moving slowly or fidgety/restless 1 0 0  1  Suicidal thoughts 0 0 0  0  PHQ-9 Score 6 5 5  9   Difficult doing work/chores  Not difficult at all Not difficult at all  Not difficult at all     Review of Systems:   Pertinent items are noted in HPI Denies fever/chills, dizziness, headaches, visual disturbances, fatigue, shortness of breath, chest pain, abdominal pain, vomiting, no problems with bowel movements, urination, or intercourse unless otherwise stated above.  Pertinent History Reviewed:  Reviewed past medical,surgical, social, obstetrical and family history.  Reviewed problem list, medications and allergies. Physical Assessment:   Vitals:   05/11/22 0908  BP: 126/73  Pulse: 73  Weight: 181 lb 12.8 oz (82.5 kg)  Height: 5' 4.75" (1.645 m)  Body mass index is  30.49 kg/m.       Physical Examination:   General appearance: alert, well appearing, and in no distress  Psych: mood appropriate, normal affect  Skin: warm & dry   Cardiovascular: normal heart rate noted  Respiratory: normal respiratory effort, no distress  Abdomen: soft, non-tender   Pelvic: normal external genitalia, vulva, vagina, cervix, uterus and adnexa, examination not indicated  Extremities: no edema, no calf tenderness bilaterally  Chaperone: N/A    Assessment & Plan:  1) AUB -doing well with POPs, continue with current regimen -discussed that should bleeding become irregular or note change RTC for further evaluation  Meds ordered this encounter  Medications   norethindrone (ORTHO MICRONOR) 0.35 MG tablet    Sig: Take 1 tablet (0.35 mg total) by mouth daily.    Dispense:  90 tablet    Refill:  4    Return in about 1 year (around 05/12/2023) for May 2024.   , DO Attending Obstetrician & Gynecologist, [facility] for 05/13/22, [facility]

## 2022-05-28 ENCOUNTER — Encounter: Payer: Self-pay | Admitting: Family

## 2022-05-28 ENCOUNTER — Ambulatory Visit (INDEPENDENT_AMBULATORY_CARE_PROVIDER_SITE_OTHER): Payer: Managed Care, Other (non HMO) | Admitting: Family

## 2022-05-28 VITALS — BP 125/76 | HR 63 | Temp 97.2°F | Ht 64.75 in | Wt 184.6 lb

## 2022-05-28 DIAGNOSIS — I872 Venous insufficiency (chronic) (peripheral): Secondary | ICD-10-CM | POA: Diagnosis not present

## 2022-05-28 DIAGNOSIS — I10 Essential (primary) hypertension: Secondary | ICD-10-CM | POA: Diagnosis not present

## 2022-05-28 DIAGNOSIS — G629 Polyneuropathy, unspecified: Secondary | ICD-10-CM

## 2022-05-28 DIAGNOSIS — N3281 Overactive bladder: Secondary | ICD-10-CM | POA: Diagnosis not present

## 2022-05-28 DIAGNOSIS — Z720 Tobacco use: Secondary | ICD-10-CM

## 2022-05-28 MED ORDER — PREGABALIN 25 MG PO CAPS: 25.0000 mg | ORAL_CAPSULE | Freq: Two times a day (BID) | ORAL | 2 refills | Status: DC

## 2022-05-28 MED ORDER — OXYBUTYNIN CHLORIDE ER 10 MG PO TB24: 10.0000 mg | ORAL_TABLET | Freq: Every day | ORAL | 2 refills | Status: DC

## 2022-05-28 NOTE — Progress Notes (Signed)
Subjective:    Patient ID: Ann Anderson, female    DOB: 19-Apr-1976, 46 y.o.   MRN: [id number]  Chief Complaint  Patient presents with   Leg Swelling    Been going on for over a month causing pain left leg worse. Has to walk a lot with job and is causing gait problems. Patient has done research and thinks her plavix is doing it.   PT presents to the office today with bilateral leg pain and swelling at the end of the day after walking at work. Reports she gets pain in bilateral legs with walking. Reports this does improve when she rests, but returns when she gets up. States the pain is worse on her left.   Reports the pain is a burning and tingling pain of 6 out 10.   She is a smoker, and smokes about 1/3 pack a day. This is decreased from from 1/2 pack a day.   Reports she has wore compression hose in the past, but stopped wearing them because she felt like this was not working. She has taken gabapentin, but started having tremors and stopped it.  Hypertension This is a chronic problem. The current episode started more than 1 year ago. The problem has been resolved since onset. The problem is controlled. Associated symptoms include malaise/fatigue and peripheral edema. Pertinent negatives include no shortness of breath. Risk factors for coronary artery disease include obesity. The current treatment provides moderate improvement.  Urinary Frequency  This is a chronic problem. The current episode started more than 1 year ago. The problem occurs intermittently. The problem has been waxing and waning. The patient is experiencing no pain. Associated symptoms include frequency.      Review of Systems  Constitutional:  Positive for malaise/fatigue.  Respiratory:  Negative for shortness of breath.   Genitourinary:  Positive for frequency.  All other systems reviewed and are negative.      Objective:   Physical Exam Vitals reviewed.  Constitutional:      General: She is not in  acute distress.    Appearance: She is well-developed. She is obese.  HENT:     Head: Normocephalic and atraumatic.     Right Ear: Tympanic membrane normal.     Left Ear: Tympanic membrane normal.  Eyes:     Pupils: Pupils are equal, round, and reactive to light.  Neck:     Thyroid: No thyromegaly.  Cardiovascular:     Rate and Rhythm: Normal rate and regular rhythm.     Heart sounds: Normal heart sounds. No murmur heard. Pulmonary:     Effort: Pulmonary effort is normal. No respiratory distress.     Breath sounds: Normal breath sounds. No wheezing.  Abdominal:     General: Bowel sounds are normal. There is no distension.     Palpations: Abdomen is soft.     Tenderness: There is no abdominal tenderness.  Musculoskeletal:        General: No tenderness.     Cervical back: Normal range of motion and neck supple.     Right lower leg: Edema (trace) present.     Left lower leg: Edema (trace) present.  Skin:    General: Skin is warm and dry.  Neurological:     Mental Status: She is alert and oriented to person, place, and time.     Cranial Nerves: No cranial nerve deficit.     Deep Tendon Reflexes: Reflexes are normal and symmetric.  Psychiatric:  Behavior: Behavior normal.        Thought Content: Thought content normal.        Judgment: Judgment normal.       BP 125/76   Pulse 63   Temp (!) 97.2 F (36.2 C) (Temporal)   Ht 5' 4.75" (1.645 m)   Wt 184 lb 9.6 oz (83.7 kg)   LMP 05/09/2022   SpO2 97%   BMI 30.96 kg/m      Assessment & Plan:  Ann Anderson comes in today with chief complaint of Leg Swelling (Been going on for over a month causing pain left leg worse. Has to walk a lot with job and is causing gait problems. Patient has done research and thinks her plavix is doing it.)   Diagnosis and orders addressed:  1. Overactive bladder - oxybutynin (DITROPAN-XL) 10 MG 24 hr tablet; Take 1 tablet (10 mg total) by mouth at bedtime. (NEEDS TO BE SEEN  BEFORE NEXT REFILL)  Dispense: 90 tablet; Refill: 2  2. Venous insufficiency  3. Essential hypertension  4. Neuropathy  5. Tobacco abuse  Discussed Neuropathy vs Claudication.  I do believe she is having neuropathy pain and will start Lyric 25 mg BID.  RTO in 1 month to recheck. If pain and swelling continues may need vascular referral.  Labs pending Health Maintenance reviewed Diet and exercise encouraged  Follow up plan: 1 month  [name], FNP

## 2022-05-28 NOTE — Patient Instructions (Addendum)
Intermittent Claudication Intermittent claudication is pain in one leg or both legs that occurs when walking or exercising and goes away when resting. This condition is a symptom of peripheral vascular disease (PVD). PVD is a disease of the blood vessels. This condition is commonly treated with physical activity, medicine, and lifestyle changes. If medical management does not improve symptoms, surgery may be done to restore blood flow. This surgery is called revascularization. What are the causes?  This condition is caused by a buildup of fatty material and other substances (plaque) within the arteries (atherosclerosis). Plaque makes arteries stiff and narrow, preventing proper blood flow to the leg muscles. Pain occurs when you walk or exercise because your muscles need more blood when you are moving and exercising but cannot get it because of poor blood flow. What increases the risk? The following factors may make you more likely to develop this condition: Smoking cigarettes. A personal history of stroke or heart disease. Age. The older you are, the higher the risk. Being inactive (sedentary lifestyle) or being overweight. A family history of atherosclerosis. Having another health condition, such as: Diabetes. High blood pressure. High cholesterol. What are the signs or symptoms? Symptoms of this condition can be in one leg or both legs, and can occur in the feet, calf, thigh, hip, or buttock over time. Symptoms may include: Aches or pains when walking. Cramps. A feeling of tightness, weakness, or heaviness. A wound on the lower leg or foot that heals poorly or does not heal. How is this diagnosed? This condition may be diagnosed based on: Your symptoms. Your medical history. A physical exam. Tests, such as: Ankle-brachial index (ABI) to check blood pressure in the legs and compare it to the pressure in the arms. Exercise ankle-brachial test. For this test, you walk on a treadmill.  Tests are done to check how the condition affects your ability to walk or exercise. Arterial duplex ultrasound to view how blood flows within arteries. CT angiogram (CTA). An X-ray machine is used to take pictures of the blood vessels after dye is injected. Magnetic resonance angiogram (MRA). This creates images of blood vessels and blood flow within them. Angiogram. In this procedure, dye is injected into arteries and then X-rays are taken. Blood tests. How is this treated? Treatment for this condition involves treating the underlying cause and managing risk factors, such as high blood pressure, high cholesterol, or diabetes. Treatment may include: Lifestyle changes, such as: Starting a supervised or home-based exercise program. Losing weight. Quitting smoking. Medicines to help restore blood flow through your legs. If you have symptoms that affect your everyday activities, or have a wound that is not healing, treatment may include: Angioplasty to open a blocked artery using an inflated balloon. Stent implant to open a blocked artery using a mesh-like tube. Surgery to restore blood flow by creating a bypass around a blocked area. Follow these instructions at home: Lifestyle  Maintain a healthy weight. Eat a diet that is low in saturated fats and calories. Consider working with a dietitianto help you make healthy food choices. Do not use any products that contain nicotine or tobacco. These products include cigarettes, chewing tobacco, and vaping devices, such as e-cigarettes. If you need help quitting, ask your health care provider. If your health care provider recommended an exercise program for you, follow it as directed. Your exercise program may involve: Walking three or more times a week. Walking until you have certain symptoms of intermittent claudication, resting until your symptoms go   away, and then resuming your walk. Gradually increasing your total walking time to about 50 minutes  a day. General instructions Work with your health care provider to manage other health conditions that may increase your risk, including diabetes, high blood pressure, or high cholesterol. Take over-the-counter and prescription medicines only as told by your health care provider. Keep all follow-up visits. This is important. Contact a health care provider if: Your pain does not go away with rest. You have sores on your legs that do not heal or have pus or a bad smell. Your condition gets worse or does not improve with treatment. Get help right away if: You have chest pain. You have trouble breathing. Your foot or leg is cold or it changes color. Your foot or leg becomes numb. You have any symptoms of a stroke. "BE FAST" is an easy way to remember the main warning signs of a stroke: B - Balance. Signs are dizziness, sudden trouble walking, or loss of balance. E - Eyes. Signs are trouble seeing or a sudden change in vision. F - Face. Signs are sudden weakness or numbness of the face, or the face or eyelid drooping on one side. A - Arms. Signs are weakness or numbness in an arm. This happens suddenly and usually on one side of the body. S - Speech. Signs are sudden trouble speaking, slurred speech, or trouble understanding what people say. T - Time. Time to call emergency services. Write down what time symptoms started. You have other signs of a stroke, such as: A sudden, severe headache with no known cause. Nausea or vomiting. Seizure. These symptoms may represent a serious problem that is an emergency. Do not wait to see if the symptoms resolve. Get medical help right away. Call your local emergency services (911 in the [place]). Do not drive yourself to the hospital.  Summary Intermittent claudication is pain in the leg or legs that occurs when walking or exercising and goes away when resting. This condition is caused by a buildup of plaque in the arteries. Plaque makes arteries stiff and  narrow, which prevents proper blood flow to the leg. Intermittent claudication can be treated with medicine and lifestyle changes. If these fail, surgery may be done to restore blood flow to the affected area. Work with your health care provider to manage other health conditions you may have, including diabetes, high blood pressure, or high cholesterol. This information is not intended to replace advice given to you by your health care provider. Make sure you discuss any questions you have with your health care provider.   Peripheral Neuropathy Peripheral neuropathy is a type of nerve damage. It affects nerves that carry signals between the spinal cord and the arms, legs, and the rest of the body (peripheral nerves). It does not affect nerves in the spinal cord or brain. In peripheral neuropathy, one nerve or a group of nerves may be damaged. Peripheral neuropathy is a broad category that includes many specific nerve disorders, like diabetic neuropathy, hereditary neuropathy, and carpal tunnel syndrome. What are the causes? This condition may be caused by: Certain diseases, such as: Diabetes. This is the most common cause of peripheral neuropathy. Autoimmune diseases, such as rheumatoid arthritis and systemic lupus erythematosus. Nerve diseases that are passed from parent to child (inherited). Kidney disease. Thyroid disease. Other causes may include: Nerve injury. Pressure or stress on a nerve that lasts a long time. Lack (deficiency) of B vitamins. This can result from alcoholism, poor diet, or  a restricted diet. Infections. Some medicines, such as cancer medicines (chemotherapy). Poisonous (toxic) substances, such as lead and mercury. Too little blood flowing to the legs. In some cases, the cause of this condition is not known. What are the signs or symptoms? Symptoms of this condition depend on which of your nerves is damaged. Symptoms in the legs, hands, and arms can include: Loss of  feeling (numbness) in the feet, hands, or both. Tingling in the feet, hands, or both. Burning pain. Very sensitive skin. Weakness. Not being able to move a part of the body (paralysis). Clumsiness or poor coordination. Muscle twitching. Loss of balance. Symptoms in other parts of the body can include: Not being able to control your bladder. Feeling dizzy. Sexual problems. How is this diagnosed? Diagnosing and finding the cause of peripheral neuropathy can be difficult. Your health care provider will take your medical history and do a physical exam. A neurological exam will also be done. This involves checking things that are affected by your brain, spinal cord, and nerves (nervous system). For example, your health care provider will check your reflexes, how you move, and what you can feel. You may have other tests, such as: Blood tests. Electromyogram (EMG) and nerve conduction tests. These tests check nerve function and how well the nerves are controlling the muscles. Imaging tests, such as a CT scan or MRI, to rule out other causes of your symptoms. Removing a small piece of nerve to be examined in a lab (nerve biopsy). Removing and examining a small amount of the fluid that surrounds the brain and spinal cord (lumbar puncture). How is this treated? Treatment for this condition may involve: Treating the underlying cause of the neuropathy, such as diabetes, kidney disease, or vitamin deficiencies. Stopping medicines that can cause neuropathy, such as chemotherapy. Medicine to help relieve pain. Medicines may include: Prescription or over-the-counter pain medicine. Anti-seizure medicine. Antidepressants. Pain-relieving patches that are applied to painful areas of skin. Surgery to relieve pressure on a nerve or to destroy a nerve that is causing pain. Physical therapy to help improve movement and balance. Devices to help you move around (assistive devices). Follow these instructions  at home: Medicines Take over-the-counter and prescription medicines only as told by your health care provider. Do not take any other medicines without first asking your health care provider. Ask your health care provider if the medicine prescribed to you requires you to avoid driving or using machinery. Lifestyle  Do not use any products that contain nicotine or tobacco. These products include cigarettes, chewing tobacco, and vaping devices, such as e-cigarettes. Smoking keeps blood from reaching damaged nerves. If you need help quitting, ask your health care provider. Avoid or limit alcohol. Too much alcohol can cause a vitamin B deficiency, and vitamin B is needed for healthy nerves. Eat a healthy diet. This includes: Eating foods that are high in fiber, such as beans, whole grains, and fresh fruits and vegetables. Limiting foods that are high in fat and processed sugars, such as fried or sweet foods. General instructions  If you have diabetes, work closely with your health care provider to keep your blood sugar under control. If you have numbness in your feet: Check every day for signs of injury or infection. Watch for redness, warmth, and swelling. Wear padded socks and comfortable shoes. These help protect your feet. Develop a good support system. Living with peripheral neuropathy can be stressful. Consider talking with a mental health specialist or joining a support group. Use  assistive devices and attend physical therapy as told by your health care provider. This may include using a walker or a cane. Keep all follow-up visits. This is important. Where to find more information [employer] of Neurological Disorders: [employer] Contact a health care provider if: You have new signs or symptoms of peripheral neuropathy. You are struggling emotionally from dealing with peripheral neuropathy. Your pain is not well controlled. Get help right away if: You have an injury or  infection that is not healing normally. You develop new weakness in an arm or leg. You have fallen or do so frequently. Summary Peripheral neuropathy is when the nerves in the arms or legs are damaged, resulting in numbness, weakness, or pain. There are many causes of peripheral neuropathy, including diabetes, pinched nerves, vitamin deficiencies, autoimmune disease, and hereditary conditions. Diagnosing and finding the cause of peripheral neuropathy can be difficult. Your health care provider will take your medical history, do a physical exam, and do tests, including blood tests and nerve function tests. Treatment involves treating the underlying cause of the neuropathy and taking medicines to help control pain. Physical therapy and assistive devices may also help. This information is not intended to replace advice given to you by your health care provider. Make sure you discuss any questions you have with your health care provider. Document Revised: 06/17/2021 Document Reviewed: 06/17/2021 Elsevier Patient Education  2023 [employer].  Document Revised: 04/15/2020 Document Reviewed: 04/15/2020 Elsevier Patient Education  2023 [employer].

## 2022-06-30 ENCOUNTER — Encounter: Payer: Self-pay | Admitting: Family

## 2022-06-30 ENCOUNTER — Ambulatory Visit (INDEPENDENT_AMBULATORY_CARE_PROVIDER_SITE_OTHER): Payer: Managed Care, Other (non HMO) | Admitting: Family

## 2022-06-30 VITALS — BP 124/68 | HR 84 | Temp 97.7°F | Ht 64.75 in | Wt 184.0 lb

## 2022-06-30 DIAGNOSIS — K219 Gastro-esophageal reflux disease without esophagitis: Secondary | ICD-10-CM

## 2022-06-30 DIAGNOSIS — I10 Essential (primary) hypertension: Secondary | ICD-10-CM

## 2022-06-30 DIAGNOSIS — G629 Polyneuropathy, unspecified: Secondary | ICD-10-CM | POA: Diagnosis not present

## 2022-06-30 DIAGNOSIS — N3281 Overactive bladder: Secondary | ICD-10-CM

## 2022-06-30 DIAGNOSIS — Z789 Other specified health status: Secondary | ICD-10-CM | POA: Diagnosis not present

## 2022-06-30 DIAGNOSIS — R079 Chest pain, unspecified: Secondary | ICD-10-CM | POA: Diagnosis not present

## 2022-06-30 MED ORDER — PREGABALIN 50 MG PO CAPS: 50.0000 mg | ORAL_CAPSULE | Freq: Two times a day (BID) | ORAL | 2 refills | Status: DC

## 2022-06-30 MED ORDER — OXYBUTYNIN CHLORIDE ER 10 MG PO TB24: 10.0000 mg | ORAL_TABLET | Freq: Every day | ORAL | 2 refills | Status: DC

## 2022-06-30 NOTE — Patient Instructions (Signed)
Neuropathic Pain Neuropathic pain is pain caused by damage to the nerves that are responsible for certain sensations in your body (sensory nerves). Neuropathic pain can make you more sensitive to pain. Even a minor sensation can feel very painful. This is usually a long-term (chronic) condition that can be difficult to treat. The type of pain differs from person to person. It may: Start suddenly (acute), or it may develop slowly and become chronic. Come and go as damaged nerves heal, or it may stay at the same level for years. Cause emotional distress, loss of sleep, and a lower quality of life. What are the causes? The most common cause of this condition is diabetes. Many other diseases and conditions can also cause neuropathic pain. Causes of neuropathic pain can be classified as: Toxic. This is caused by medicines and chemicals. The most common causes of toxic neuropathic pain is damage from medicines that kill cancer cells (chemotherapy) or alcohol abuse. Metabolic. This can be caused by: Diabetes. Lack of vitamins like B12. Traumatic. Any injury that cuts, crushes, or stretches a nerve can cause damage and pain. Compression-related. If a sensory nerve gets trapped or compressed for a long period of time, the blood supply to the nerve can be cut off. Vascular. Many blood vessel diseases can cause neuropathic pain by decreasing blood supply and oxygen to nerves. Autoimmune. This type of pain results from diseases in which the body's defense system (immune system) mistakenly attacks sensory nerves. Examples of autoimmune diseases that can cause neuropathic pain include lupus and multiple sclerosis. Infectious. Many types of viral infections can damage sensory nerves and cause pain. Shingles infection is a common cause of this type of pain. Inherited. Neuropathic pain can be a symptom of many diseases that are passed down through families (genetic). What increases the risk? You are more likely to  develop this condition if: You have diabetes. You smoke. You drink too much alcohol. You are taking certain medicines, including chemotherapy or medicines that treat immune system disorders. What are the signs or symptoms? The main symptom is pain. Neuropathic pain is often described as: Burning. Shock-like. Stinging. Hot or cold. Itching. How is this diagnosed? No single test can diagnose neuropathic pain. It is diagnosed based on: A physical exam and your symptoms. Your health care provider will ask you about your pain. You may be asked to use a pain scale to describe how bad your pain is. Tests. These may be done to see if you have a cause and location of any nerve damage. They include: Nerve conduction studies and electromyography to test how well nerve signals travel through your nerves and muscles (electrodiagnostic testing). Skin biopsy to evaluate for small fiber neuropathy. Imaging studies, such as: X-rays. CT scan. MRI. How is this treated? Treatment for neuropathic pain may change over time. You may need to try different treatment options or a combination of treatments. Some options include: Treating the underlying cause of the neuropathy, such as diabetes, kidney disease, or vitamin deficiencies. Stopping medicines that can cause neuropathy, such as chemotherapy. Medicine to relieve pain. Medicines may include: Prescription or over-the-counter pain medicine. Anti-seizure medicine. Antidepressant medicines. Pain-relieving patches or creams that are applied to painful areas of skin. A medicine to numb the area (local anesthetic), which can be injected as a nerve block. Transcutaneous nerve stimulation. This uses electrical currents to block painful nerve signals. The treatment is painless. Alternative treatments, such as: Acupuncture. Meditation. Massage. Occupational or physical therapy. Pain management programs. Counseling. Follow   these instructions at  home: Medicines  Take over-the-counter and prescription medicines only as told by your health care provider. Ask your health care provider if the medicine prescribed to you: Requires you to avoid driving or using machinery. Can cause constipation. You may need to take these actions to prevent or treat constipation: Drink enough fluid to keep your urine pale yellow. Take over-the-counter or prescription medicines. Eat foods that are high in fiber, such as beans, whole grains, and fresh fruits and vegetables. Limit foods that are high in fat and processed sugars, such as fried or sweet foods. Lifestyle  Have a good support system at home. Consider joining a chronic pain support group. Do not use any products that contain nicotine or tobacco. These products include cigarettes, chewing tobacco, and vaping devices, such as e-cigarettes. If you need help quitting, ask your health care provider. Do not drink alcohol. General instructions Learn as much as you can about your condition. Work closely with all your health care providers to find the treatment plan that works best for you. Ask your health care provider what activities are safe for you. Keep all follow-up visits. This is important. Contact a health care provider if: Your pain treatments are not working. You are having side effects from your medicines. You are struggling with tiredness (fatigue), mood changes, depression, or anxiety. Get help right away if: You have thoughts of hurting yourself. Get help right away if you feel like you may hurt yourself or others, or have thoughts about taking your own life. Go to your nearest emergency room or: Call 911. Call the National Suicide Prevention Lifeline at 1-800-273-8255 or 988. This is open 24 hours a day. Text the Crisis Text Line at 741741. Summary Neuropathic pain is pain caused by damage to the nerves that are responsible for certain sensations in your body (sensory  nerves). Neuropathic pain may come and go as damaged nerves heal, or it may stay at the same level for years. Neuropathic pain is usually a long-term condition that can be difficult to treat. Consider joining a chronic pain support group. This information is not intended to replace advice given to you by your health care provider. Make sure you discuss any questions you have with your health care provider. Document Revised: 06/09/2021 Document Reviewed: 06/09/2021 Elsevier Patient Education  2023 [employer] Inc.  

## 2022-06-30 NOTE — Progress Notes (Signed)
Subjective:    Patient ID: Ann Anderson, female    DOB: [birth year], 46 y.o.   MRN: [id number]  Chief Complaint  Patient presents with   Follow-up   er follow up   PT presents to the office today recheck neuropathy. We started Lyrica 25 mg BID. States this has helped with her leg pain. Reports it seems to wear off after 6 hours. She is wearing compression hose daily that is helping.   She also complaining of overactive bladder. We started her oxybutynin 10 mg. States this has helped.   She went to the ED for chest pain yesterday. She was diagnosed with GERD. They recommend her seeing a Cardiologists. Pt is adopted and does not know family hx. She is a smoker, but less than <1/2 pack a day.  Hypertension This is a chronic problem. The current episode started more than 1 year ago. The problem has been resolved since onset. The problem is controlled. Associated symptoms include malaise/fatigue. Pertinent negatives include no peripheral edema or shortness of breath. Risk factors for coronary artery disease include dyslipidemia and sedentary lifestyle. The current treatment provides moderate improvement.  Nicotine Dependence Presents for follow-up visit. Her urge triggers include company of smokers. The symptoms have been stable. She smokes < 1/2 a pack of cigarettes per day.  Gastroesophageal Reflux She complains of belching and heartburn. This is a chronic problem. The current episode started more than 1 year ago. The problem occurs occasionally. She has tried a PPI for the symptoms. The treatment provided moderate relief.      Review of Systems  Constitutional:  Positive for malaise/fatigue.  Respiratory:  Negative for shortness of breath.   Gastrointestinal:  Positive for heartburn.  All other systems reviewed and are negative.      Objective:   Physical Exam Vitals reviewed.  Constitutional:      General: She is not in acute distress.    Appearance: She is  well-developed.  HENT:     Head: Normocephalic and atraumatic.     Right Ear: Tympanic membrane normal.     Left Ear: Tympanic membrane normal.  Eyes:     Pupils: Pupils are equal, round, and reactive to light.  Neck:     Thyroid: No thyromegaly.  Cardiovascular:     Rate and Rhythm: Normal rate and regular rhythm.     Heart sounds: Normal heart sounds. No murmur heard. Pulmonary:     Effort: Pulmonary effort is normal. No respiratory distress.     Breath sounds: Normal breath sounds. No wheezing.  Abdominal:     General: Bowel sounds are normal. There is no distension.     Palpations: Abdomen is soft.     Tenderness: There is no abdominal tenderness.  Musculoskeletal:        General: No tenderness. Normal range of motion.     Cervical back: Normal range of motion and neck supple.  Skin:    General: Skin is warm and dry.  Neurological:     Mental Status: She is alert and oriented to person, place, and time.     Cranial Nerves: No cranial nerve deficit.     Deep Tendon Reflexes: Reflexes are normal and symmetric.  Psychiatric:        Behavior: Behavior normal.        Thought Content: Thought content normal.        Judgment: Judgment normal.       BP 124/68   Pulse 84  Temp 97.7 F (36.5 C) (Temporal)   Ht 5' 4.75" (1.645 m)   Wt 184 lb (83.5 kg)   SpO2 98%   BMI 30.86 kg/m      Assessment & Plan:   Ann Anderson comes in today with chief complaint of Follow-up and er follow up   Diagnosis and orders addressed:  1. Essential hypertension  2. Neuropathy Will increase Lyrica to 50 mg from 25 mg  - pregabalin (LYRICA) 50 MG capsule; Take 1 capsule (50 mg total) by mouth 2 (two) times daily.  Dispense: 60 capsule; Refill: 2  3. Unobtainable family history due to adoption - Ambulatory referral to Cardiology  4. Chest pain, unspecified type - Ambulatory referral to Cardiology  5. Overactive bladder - oxybutynin (DITROPAN-XL) 10 MG 24 hr tablet;  Take 1 tablet (10 mg total) by mouth at bedtime.  Dispense: 90 tablet; Refill: 2  6. Gastroesophageal reflux disease, unspecified whether esophagitis present   Labs pending Health Maintenance reviewed Diet and exercise encouraged  Follow up plan: 3 months    Ann Rodney, FNP

## 2022-07-06 ENCOUNTER — Other Ambulatory Visit: Payer: Self-pay | Admitting: Family

## 2022-07-06 DIAGNOSIS — Z8673 Personal history of transient ischemic attack (TIA), and cerebral infarction without residual deficits: Secondary | ICD-10-CM

## 2022-07-06 DIAGNOSIS — I10 Essential (primary) hypertension: Secondary | ICD-10-CM

## 2022-07-06 DIAGNOSIS — E785 Hyperlipidemia, unspecified: Secondary | ICD-10-CM

## 2022-07-14 ENCOUNTER — Encounter: Payer: Self-pay | Admitting: Cardiology

## 2022-08-26 ENCOUNTER — Other Ambulatory Visit: Payer: Self-pay | Admitting: Family

## 2022-08-26 DIAGNOSIS — Z1231 Encounter for screening mammogram for malignant neoplasm of breast: Secondary | ICD-10-CM

## 2022-08-29 ENCOUNTER — Other Ambulatory Visit: Payer: Self-pay | Admitting: Family

## 2022-08-31 ENCOUNTER — Ambulatory Visit
Admission: RE | Admit: 2022-08-31 | Discharge: 2022-08-31 | Disposition: A | Discharge status: Home / Self Care | Payer: Managed Care, Other (non HMO) | Source: Ambulatory Visit | Attending: Family | Admitting: Family

## 2022-08-31 ENCOUNTER — Other Ambulatory Visit: Payer: Self-pay | Admitting: Family

## 2022-08-31 DIAGNOSIS — Z1231 Encounter for screening mammogram for malignant neoplasm of breast: Secondary | ICD-10-CM

## 2022-09-29 ENCOUNTER — Ambulatory Visit (INDEPENDENT_AMBULATORY_CARE_PROVIDER_SITE_OTHER): Payer: Managed Care, Other (non HMO) | Admitting: Family

## 2022-09-29 ENCOUNTER — Encounter: Payer: Self-pay | Admitting: Family

## 2022-09-29 VITALS — BP 153/84 | HR 57 | Temp 97.7°F | Ht 64.75 in | Wt 184.4 lb

## 2022-09-29 DIAGNOSIS — N3281 Overactive bladder: Secondary | ICD-10-CM

## 2022-09-29 DIAGNOSIS — F331 Major depressive disorder, recurrent, moderate: Secondary | ICD-10-CM

## 2022-09-29 DIAGNOSIS — E785 Hyperlipidemia, unspecified: Secondary | ICD-10-CM | POA: Diagnosis not present

## 2022-09-29 DIAGNOSIS — I10 Essential (primary) hypertension: Secondary | ICD-10-CM

## 2022-09-29 DIAGNOSIS — M47816 Spondylosis without myelopathy or radiculopathy, lumbar region: Secondary | ICD-10-CM

## 2022-09-29 DIAGNOSIS — Z0001 Encounter for general adult medical examination with abnormal findings: Secondary | ICD-10-CM | POA: Diagnosis not present

## 2022-09-29 DIAGNOSIS — Z720 Tobacco use: Secondary | ICD-10-CM

## 2022-09-29 DIAGNOSIS — Z0000 Encounter for general adult medical examination without abnormal findings: Secondary | ICD-10-CM

## 2022-09-29 DIAGNOSIS — G629 Polyneuropathy, unspecified: Secondary | ICD-10-CM

## 2022-09-29 MED ORDER — HYDROCHLOROTHIAZIDE 25 MG PO TABS: 25.0000 mg | ORAL_TABLET | Freq: Every day | ORAL | 3 refills | Status: DC

## 2022-09-29 MED ORDER — PREGABALIN 50 MG PO CAPS: 50.0000 mg | ORAL_CAPSULE | Freq: Two times a day (BID) | ORAL | 2 refills | Status: DC

## 2022-09-29 NOTE — Progress Notes (Signed)
Subjective:    Patient ID: Ann Anderson, female    DOB: [birth year], 46 y.o.   MRN: [id number]  Chief Complaint  Patient presents with   Medical Management of Chronic Issues   PT presents to the office today for CPE and  chronic follow up.   She has neuropathy pain of burning pain in bilateral legs of 6 out 10. She is taking Lyrica 50 mg BID with moderate relief.   She was seen in the ED on 09/27/22 for an abscess on her face. She was given Clindamycin and had it I&D. Reports it is doing much better.  Hypertension This is a chronic problem. The current episode started more than 1 year ago. The problem has been resolved since onset. The problem is controlled. Pertinent negatives include no malaise/fatigue, peripheral edema or shortness of breath. Risk factors for coronary artery disease include dyslipidemia, obesity, sedentary lifestyle and smoking/tobacco exposure. The current treatment provides moderate improvement.  Arthritis Presents for follow-up visit. She complains of pain and stiffness. The symptoms have been stable. Affected locations include the right knee, left knee, right hip and left hip. Her pain is at a severity of 3/10. Associated symptoms include fatigue.  Urinary Frequency  This is a chronic problem. The current episode started more than 1 year ago. The problem occurs intermittently. The patient is experiencing no pain. Associated symptoms include frequency.  Nicotine Dependence Presents for follow-up visit. Symptoms include fatigue. Her urge triggers include company of smokers. The symptoms have been stable. She smokes < 1/2 a pack of cigarettes per day.  Depression        This is a chronic problem.  The current episode started more than 1 year ago.   The onset quality is gradual.   The problem occurs intermittently.  Associated symptoms include fatigue and decreased interest.  Associated symptoms include no helplessness, no hopelessness and not sad.  Past  treatments include nothing. Hyperlipidemia This is a chronic problem. The current episode started more than 1 year ago. The problem is controlled. Recent lipid tests were reviewed and are normal. Pertinent negatives include no shortness of breath. Current antihyperlipidemic treatment includes statins. The current treatment provides moderate improvement of lipids. Risk factors for coronary artery disease include dyslipidemia, hypertension, a sedentary lifestyle and post-menopausal.       Review of Systems  Constitutional:  Positive for fatigue. Negative for malaise/fatigue.  Respiratory:  Negative for shortness of breath.   Genitourinary:  Positive for frequency.  Musculoskeletal:  Positive for arthritis and stiffness.  Psychiatric/Behavioral:  Positive for depression.   All other systems reviewed and are negative.  Family History  Problem Relation Age of Onset   Cancer Mother        breast   Heart disease Father    Dementia Father    Breast cancer Neg Hx    Social History   Socioeconomic History   Marital status: Married    Spouse name: Not on file   Number of children: 1   Years of education: Not on file   Highest education level: Not on file  Occupational History   Occupation: [employer]  Tobacco Use   Smoking status: Every Day    Packs/day: 0.50    Years: 6.00    Total pack years: 3.00    Types: Cigarettes   Smokeless tobacco: Never   Tobacco comments:    Restarted 2 years ago and trying to cut back.   Vaping Use   Vaping Use:  Every day   Substances: Nicotine  Substance and Sexual Activity   Alcohol use: Yes    Comment: Social   Drug use: Never   Sexual activity: Not Currently    Birth control/protection: Surgical  Other Topics Concern   Not on file  Social History Narrative   Right handed   Lives in a single story home with daughter and cousins   Works at [employer]   Caffeine - trying to cut back now mountain dew 2 cans/day; coffee 6-18 oz    [employer]. Degree   Exercise at work - walks   Social Determinants of Health   Financial Resource Strain: Low Risk  (02/23/2022)   Overall Financial Resource Strain (CARDIA)    Difficulty of Paying Living Expenses: Not very hard  Food Insecurity: No Food Insecurity (02/23/2022)   Hunger Vital Sign    Worried About Running Out of Food in the Last Year: Never true    [place] in the Last Year: Never true  Transportation Needs: No Transportation Needs (02/23/2022)   PRAPARE - Hydrologist (Medical): No    Lack of Transportation (Non-Medical): No  Physical Activity: Sufficiently Active (02/23/2022)   Exercise Vital Sign    Days of Exercise per Week: 5 days    Minutes of Exercise per Session: 80 min  Stress: Stress Concern Present (02/23/2022)   [place]    Feeling of Stress : To some extent  Social Connections: Moderately Isolated (02/23/2022)   Social Connection and Isolation Panel [NHANES]    Frequency of Communication with Friends and Family: More than three times a week    Frequency of Social Gatherings with Friends and Family: Twice a week    Attends Religious Services: Never    Marine scientist or Organizations: No    Attends Archivist Meetings: Never    Marital Status: Married       Objective:   Physical Exam Vitals reviewed.  Constitutional:      General: She is not in acute distress.    Appearance: She is well-developed. She is obese.  HENT:     Head: Normocephalic and atraumatic.     Right Ear: Tympanic membrane normal.     Left Ear: Tympanic membrane normal.  Eyes:     Pupils: Pupils are equal, round, and reactive to light.  Neck:     Thyroid: No thyromegaly.  Cardiovascular:     Rate and Rhythm: Normal rate and regular rhythm.     Heart sounds: Normal heart sounds. No murmur heard. Pulmonary:     Effort: Pulmonary effort is normal. No respiratory  distress.     Breath sounds: Normal breath sounds. No wheezing.  Abdominal:     General: Bowel sounds are normal. There is no distension.     Palpations: Abdomen is soft.     Tenderness: There is no abdominal tenderness.  Musculoskeletal:        General: No tenderness. Normal range of motion.     Cervical back: Normal range of motion and neck supple.  Skin:    General: Skin is warm and dry.     Comments: Abscess on right face that is healing   Neurological:     Mental Status: She is alert and oriented to person, place, and time.     Cranial Nerves: No cranial nerve deficit.     Deep Tendon Reflexes: Reflexes are normal  and symmetric.  Psychiatric:        Behavior: Behavior normal.        Thought Content: Thought content normal.        Judgment: Judgment normal.          BP (!) 153/84   Pulse (!) 57   Temp 97.7 F (36.5 C) (Temporal)   Ht 5' 4.75" (1.645 m)   Wt 184 lb 6.4 oz (83.6 kg)   BMI 30.92 kg/m   Assessment & Plan:  Jalexis Breed comes in today with chief complaint of Medical Management of Chronic Issues   Diagnosis and orders addressed:  1. Essential hypertension - CMP14+EGFR - CBC with Differential/Platelet  2. Arthritis, lumbar spine - CMP14+EGFR - CBC with Differential/Platelet  3. Hyperlipidemia, unspecified hyperlipidemia type - CMP14+EGFR - CBC with Differential/Platelet  4. Overactive bladder - CMP14+EGFR - CBC with Differential/Platelet  5. Tobacco abuse - CMP14+EGFR - CBC with Differential/Platelet  6. Neuropathy - CMP14+EGFR - CBC with Differential/Platelet - pregabalin (LYRICA) 50 MG capsule; Take 1 capsule (50 mg total) by mouth 2 (two) times daily.  Dispense: 60 capsule; Refill: 2  7. MDD (major depressive disorder), recurrent episode, moderate (HCC) - CMP14+EGFR - CBC with Differential/Platelet  8. Annual physical exam - CMP14+EGFR - CBC with Differential/Platelet - Lipid panel - TSH  9. Primary  hypertension Increase HTCZ to 25 mg from 12.5 mg  -Daily blood pressure log given with instructions on how to fill out and told to bring to next visit -Dash diet information given -Exercise encouraged - Stress Management  -Continue current meds -RTO in 2 weeks  - CMP14+EGFR - CBC with Differential/Platelet - hydrochlorothiazide (HYDRODIURIL) 25 MG tablet; Take 1 tablet (25 mg total) by mouth daily.  Dispense: 90 tablet; Refill: 3   Labs pending Health Maintenance reviewed Diet and exercise encouraged  Follow up plan: 2 weeks to recheck HTN   [name], FNP

## 2022-09-29 NOTE — Patient Instructions (Signed)
Hypertension, Adult High blood pressure (hypertension) is when the force of blood pumping through the arteries is too strong. The arteries are the blood vessels that carry blood from the heart throughout the body. Hypertension forces the heart to work harder to pump blood and may cause arteries to become narrow or stiff. Untreated or uncontrolled hypertension can lead to a heart attack, heart failure, a stroke, kidney disease, and other problems. A blood pressure reading consists of a higher number over a lower number. Ideally, your blood pressure should be below 120/80. The first ("top") number is called the systolic pressure. It is a measure of the pressure in your arteries as your heart beats. The second ("bottom") number is called the diastolic pressure. It is a measure of the pressure in your arteries as the heart relaxes. What are the causes? The exact cause of this condition is not known. There are some conditions that result in high blood pressure. What increases the risk? Certain factors may make you more likely to develop high blood pressure. Some of these risk factors are under your control, including: Smoking. Not getting enough exercise or physical activity. Being overweight. Having too much fat, sugar, calories, or salt (sodium) in your diet. Drinking too much alcohol. Other risk factors include: Having a personal history of heart disease, diabetes, high cholesterol, or kidney disease. Stress. Having a family history of high blood pressure and high cholesterol. Having obstructive sleep apnea. Age. The risk increases with age. What are the signs or symptoms? High blood pressure may not cause symptoms. Very high blood pressure (hypertensive crisis) may cause: Headache. Fast or irregular heartbeats (palpitations). Shortness of breath. Nosebleed. Nausea and vomiting. Vision changes. Severe chest pain, dizziness, and seizures. How is this diagnosed? This condition is diagnosed by  measuring your blood pressure while you are seated, with your arm resting on a flat surface, your legs uncrossed, and your feet flat on the floor. The cuff of the blood pressure monitor will be placed directly against the skin of your upper arm at the level of your heart. Blood pressure should be measured at least twice using the same arm. Certain conditions can cause a difference in blood pressure between your right and left arms. If you have a high blood pressure reading during one visit or you have normal blood pressure with other risk factors, you may be asked to: Return on a different day to have your blood pressure checked again. Monitor your blood pressure at home for 1 week or longer. If you are diagnosed with hypertension, you may have other blood or imaging tests to help your health care provider understand your overall risk for other conditions. How is this treated? This condition is treated by making healthy lifestyle changes, such as eating healthy foods, exercising more, and reducing your alcohol intake. You may be referred for counseling on a healthy diet and physical activity. Your health care provider may prescribe medicine if lifestyle changes are not enough to get your blood pressure under control and if: Your systolic blood pressure is above 130. Your diastolic blood pressure is above 80. Your personal target blood pressure may vary depending on your medical conditions, your age, and other factors. Follow these instructions at home: Eating and drinking  Eat a diet that is high in fiber and potassium, and low in sodium, added sugar, and fat. An example of this eating plan is called the DASH diet. DASH stands for Dietary Approaches to Stop Hypertension. To eat this way: Eat   plenty of fresh fruits and vegetables. Try to fill one half of your plate at each meal with fruits and vegetables. Eat whole grains, such as whole-wheat pasta, brown rice, or whole-grain bread. Fill about one  fourth of your plate with whole grains. Eat or drink low-fat dairy products, such as skim milk or low-fat yogurt. Avoid fatty cuts of meat, processed or cured meats, and poultry with skin. Fill about one fourth of your plate with lean proteins, such as fish, chicken without skin, beans, eggs, or tofu. Avoid pre-made and processed foods. These tend to be higher in sodium, added sugar, and fat. Reduce your daily sodium intake. Many people with hypertension should eat less than 1,500 mg of sodium a day. Do not drink alcohol if: Your health care provider tells you not to drink. You are pregnant, may be pregnant, or are planning to become pregnant. If you drink alcohol: Limit how much you have to: 0-1 drink a day for women. 0-2 drinks a day for men. Know how much alcohol is in your drink. In the [place] one drink equals one 12 oz bottle of beer (355 mL), one 5 oz glass of wine (148 mL), or one 1 oz glass of hard liquor (44 mL). Lifestyle  Work with your health care provider to maintain a healthy body weight or to lose weight. Ask what an ideal weight is for you. Get at least 30 minutes of exercise that causes your heart to beat faster (aerobic exercise) most days of the week. Activities may include walking, swimming, or biking. Include exercise to strengthen your muscles (resistance exercise), such as Pilates or lifting weights, as part of your weekly exercise routine. Try to do these types of exercises for 30 minutes at least 3 days a week. Do not use any products that contain nicotine or tobacco. These products include cigarettes, chewing tobacco, and vaping devices, such as e-cigarettes. If you need help quitting, ask your health care provider. Monitor your blood pressure at home as told by your health care provider. Keep all follow-up visits. This is important. Medicines Take over-the-counter and prescription medicines only as told by your health care provider. Follow directions carefully. Blood  pressure medicines must be taken as prescribed. Do not skip doses of blood pressure medicine. Doing this puts you at risk for problems and can make the medicine less effective. Ask your health care provider about side effects or reactions to medicines that you should watch for. Contact a health care provider if you: Think you are having a reaction to a medicine you are taking. Have headaches that keep coming back (recurring). Feel dizzy. Have swelling in your ankles. Have trouble with your vision. Get help right away if you: Develop a severe headache or confusion. Have unusual weakness or numbness. Feel faint. Have severe pain in your chest or abdomen. Vomit repeatedly. Have trouble breathing. These symptoms may be an emergency. Get help right away. Call 911. Do not wait to see if the symptoms will go away. Do not drive yourself to the hospital. Summary Hypertension is when the force of blood pumping through your arteries is too strong. If this condition is not controlled, it may put you at risk for serious complications. Your personal target blood pressure may vary depending on your medical conditions, your age, and other factors. For most people, a normal blood pressure is less than 120/80. Hypertension is treated with lifestyle changes, medicines, or a combination of both. Lifestyle changes include losing weight, eating a healthy,   low-sodium diet, exercising more, and limiting alcohol. This information is not intended to replace advice given to you by your health care provider. Make sure you discuss any questions you have with your health care provider. Document Revised: 08/19/2021 Document Reviewed: 08/19/2021 Elsevier Patient Education  2023 Elsevier Inc.  

## 2022-09-30 LAB — CMP14+EGFR
ALT: 19 IU/L (ref 0–32)
AST: 13 IU/L (ref 0–40)
Albumin/Globulin Ratio: 1.7 (ref 1.2–2.2)
Albumin: 4.1 g/dL (ref 3.9–4.9)
Alkaline Phosphatase: 68 IU/L (ref 44–121)
BUN/Creatinine Ratio: 13 (ref 9–23)
BUN: 10 mg/dL (ref 6–24)
Bilirubin Total: 0.4 mg/dL (ref 0.0–1.2)
CO2: 20 mmol/L (ref 20–29)
Calcium: 8.9 mg/dL (ref 8.7–10.2)
Chloride: 106 mmol/L (ref 96–106)
Creatinine, Ser: 0.75 mg/dL (ref 0.57–1.00)
Globulin, Total: 2.4 g/dL (ref 1.5–4.5)
Glucose: 109 mg/dL — ABNORMAL HIGH (ref 70–99)
Potassium: 4 mmol/L (ref 3.5–5.2)
Sodium: 143 mmol/L (ref 134–144)
Total Protein: 6.5 g/dL (ref 6.0–8.5)
eGFR: 99 mL/min/{1.73_m2} (ref >59)

## 2022-09-30 LAB — CBC WITH DIFFERENTIAL/PLATELET
Basophils Absolute: 0.1 10*3/uL (ref 0.0–0.2)
Basos: 1 %
EOS (ABSOLUTE): 0.3 10*3/uL (ref 0.0–0.4)
Eos: 4 %
Hematocrit: 33.6 % — ABNORMAL LOW (ref 34.0–46.6)
Hemoglobin: 11.5 g/dL (ref 11.1–15.9)
Immature Grans (Abs): 0 10*3/uL (ref 0.0–0.1)
Immature Granulocytes: 0 %
Lymphocytes Absolute: 1.8 10*3/uL (ref 0.7–3.1)
Lymphs: 26 %
MCH: 27.6 pg (ref 26.6–33.0)
MCHC: 34.2 g/dL (ref 31.5–35.7)
MCV: 81 fL (ref 79–97)
Monocytes Absolute: 0.5 10*3/uL (ref 0.1–0.9)
Monocytes: 7 %
Neutrophils Absolute: 4.2 10*3/uL (ref 1.4–7.0)
Neutrophils: 62 %
Platelets: 257 10*3/uL (ref 150–450)
RBC: 4.17 x10E6/uL (ref 3.77–5.28)
RDW: 13.2 % (ref 11.7–15.4)
WBC: 6.8 10*3/uL (ref 3.4–10.8)

## 2022-09-30 LAB — LIPID PANEL
Chol/HDL Ratio: 4.9 ratio — ABNORMAL HIGH (ref 0.0–4.4)
Cholesterol, Total: 186 mg/dL (ref 100–199)
HDL: 38 mg/dL — ABNORMAL LOW (ref >39)
LDL Chol Calc (NIH): 130 mg/dL — ABNORMAL HIGH (ref 0–99)
Triglycerides: 100 mg/dL (ref 0–149)
VLDL Cholesterol Cal: 18 mg/dL (ref 5–40)

## 2022-09-30 LAB — TSH: TSH: 3.03 u[IU]/mL (ref 0.450–4.500)

## 2022-10-14 ENCOUNTER — Other Ambulatory Visit: Payer: Self-pay | Admitting: Family

## 2022-10-14 DIAGNOSIS — E785 Hyperlipidemia, unspecified: Secondary | ICD-10-CM

## 2022-11-03 ENCOUNTER — Encounter: Payer: Self-pay | Admitting: Family

## 2022-11-03 ENCOUNTER — Ambulatory Visit: Payer: Managed Care, Other (non HMO) | Admitting: Family

## 2022-11-03 VITALS — BP 129/78 | HR 61 | Temp 97.3°F | Ht 64.5 in | Wt 184.4 lb

## 2022-11-03 DIAGNOSIS — Z720 Tobacco use: Secondary | ICD-10-CM

## 2022-11-03 DIAGNOSIS — I10 Essential (primary) hypertension: Secondary | ICD-10-CM

## 2022-11-03 NOTE — Progress Notes (Signed)
   Subjective:    Patient ID: Ann Anderson, female    DOB: Mar 08, 1976, 47 y.o.   MRN: [id number]  Chief Complaint  Patient presents with   Hypertension     2 weeks to recheck HTN      PT presents to the office today to recheck HTN. She was seen on 09/30/23 and we increased her HCTZ to 25 mg from 12.5 mg. Her BP today is at goal! Hypertension This is a chronic problem. The current episode started more than 1 year ago. The problem has been resolved since onset. The problem is controlled. Associated symptoms include malaise/fatigue. Pertinent negatives include no peripheral edema or shortness of breath. Risk factors for coronary artery disease include dyslipidemia and sedentary lifestyle. Past treatments include diuretics. The current treatment provides moderate improvement.  Nicotine Dependence Presents for follow-up visit. Her urge triggers include company of smokers. The symptoms have been stable. She smokes < 1/2 a pack of cigarettes per day.      Review of Systems  Constitutional:  Positive for malaise/fatigue.  Respiratory:  Negative for shortness of breath.   All other systems reviewed and are negative.      Objective:   Physical Exam Vitals reviewed.  Constitutional:      General: She is not in acute distress.    Appearance: She is well-developed.  HENT:     Head: Normocephalic and atraumatic.     Right Ear: Tympanic membrane normal.     Left Ear: Tympanic membrane normal.  Eyes:     Pupils: Pupils are equal, round, and reactive to light.  Neck:     Thyroid: No thyromegaly.  Cardiovascular:     Rate and Rhythm: Normal rate and regular rhythm.     Heart sounds: Normal heart sounds. No murmur heard. Pulmonary:     Effort: Pulmonary effort is normal. No respiratory distress.     Breath sounds: Normal breath sounds. No wheezing.  Abdominal:     General: Bowel sounds are normal. There is no distension.     Palpations: Abdomen is soft.     Tenderness: There  is no abdominal tenderness.  Musculoskeletal:        General: No tenderness. Normal range of motion.     Cervical back: Normal range of motion and neck supple.  Skin:    General: Skin is warm and dry.  Neurological:     Mental Status: She is alert and oriented to person, place, and time.     Cranial Nerves: No cranial nerve deficit.     Deep Tendon Reflexes: Reflexes are normal and symmetric.  Psychiatric:        Behavior: Behavior normal.        Thought Content: Thought content normal.        Judgment: Judgment normal.      BP 129/78   Pulse 61   Temp (!) 97.3 F (36.3 C) (Temporal)   Ht 5' 4.5" (1.638 m)   Wt 184 lb 6.4 oz (83.6 kg)   SpO2 99%   BMI 31.16 kg/m       Assessment & Plan:  [name] comes in today with chief complaint of Hypertension (/2 weeks to recheck HTN/ /)   Diagnosis and orders addressed:  1. Essential hypertension -Dash diet information given -Exercise encouraged - Stress Management  -Continue current meds -RTO in 6 months  - BMP8+EGFR  2. Tobacco abuse - BMP8+EGFR    [name], FNP

## 2022-11-03 NOTE — Patient Instructions (Signed)
Hypertension, Adult High blood pressure (hypertension) is when the force of blood pumping through the arteries is too strong. The arteries are the blood vessels that carry blood from the heart throughout the body. Hypertension forces the heart to work harder to pump blood and may cause arteries to become narrow or stiff. Untreated or uncontrolled hypertension can lead to a heart attack, heart failure, a stroke, kidney disease, and other problems. A blood pressure reading consists of a higher number over a lower number. Ideally, your blood pressure should be below 120/80. The first ("top") number is called the systolic pressure. It is a measure of the pressure in your arteries as your heart beats. The second ("bottom") number is called the diastolic pressure. It is a measure of the pressure in your arteries as the heart relaxes. What are the causes? The exact cause of this condition is not known. There are some conditions that result in high blood pressure. What increases the risk? Certain factors may make you more likely to develop high blood pressure. Some of these risk factors are under your control, including: Smoking. Not getting enough exercise or physical activity. Being overweight. Having too much fat, sugar, calories, or salt (sodium) in your diet. Drinking too much alcohol. Other risk factors include: Having a personal history of heart disease, diabetes, high cholesterol, or kidney disease. Stress. Having a family history of high blood pressure and high cholesterol. Having obstructive sleep apnea. Age. The risk increases with age. What are the signs or symptoms? High blood pressure may not cause symptoms. Very high blood pressure (hypertensive crisis) may cause: Headache. Fast or irregular heartbeats (palpitations). Shortness of breath. Nosebleed. Nausea and vomiting. Vision changes. Severe chest pain, dizziness, and seizures. How is this diagnosed? This condition is diagnosed by  measuring your blood pressure while you are seated, with your arm resting on a flat surface, your legs uncrossed, and your feet flat on the floor. The cuff of the blood pressure monitor will be placed directly against the skin of your upper arm at the level of your heart. Blood pressure should be measured at least twice using the same arm. Certain conditions can cause a difference in blood pressure between your right and left arms. If you have a high blood pressure reading during one visit or you have normal blood pressure with other risk factors, you may be asked to: Return on a different day to have your blood pressure checked again. Monitor your blood pressure at home for 1 week or longer. If you are diagnosed with hypertension, you may have other blood or imaging tests to help your health care provider understand your overall risk for other conditions. How is this treated? This condition is treated by making healthy lifestyle changes, such as eating healthy foods, exercising more, and reducing your alcohol intake. You may be referred for counseling on a healthy diet and physical activity. Your health care provider may prescribe medicine if lifestyle changes are not enough to get your blood pressure under control and if: Your systolic blood pressure is above 130. Your diastolic blood pressure is above 80. Your personal target blood pressure may vary depending on your medical conditions, your age, and other factors. Follow these instructions at home: Eating and drinking  Eat a diet that is high in fiber and potassium, and low in sodium, added sugar, and fat. An example of this eating plan is called the DASH diet. DASH stands for Dietary Approaches to Stop Hypertension. To eat this way: Eat   plenty of fresh fruits and vegetables. Try to fill one half of your plate at each meal with fruits and vegetables. Eat whole grains, such as whole-wheat pasta, brown rice, or whole-grain bread. Fill about one  fourth of your plate with whole grains. Eat or drink low-fat dairy products, such as skim milk or low-fat yogurt. Avoid fatty cuts of meat, processed or cured meats, and poultry with skin. Fill about one fourth of your plate with lean proteins, such as fish, chicken without skin, beans, eggs, or tofu. Avoid pre-made and processed foods. These tend to be higher in sodium, added sugar, and fat. Reduce your daily sodium intake. Many people with hypertension should eat less than 1,500 mg of sodium a day. Do not drink alcohol if: Your health care provider tells you not to drink. You are pregnant, may be pregnant, or are planning to become pregnant. If you drink alcohol: Limit how much you have to: 0-1 drink a day for women. 0-2 drinks a day for men. Know how much alcohol is in your drink. In the [place] one drink equals one 12 oz bottle of beer (355 mL), one 5 oz glass of wine (148 mL), or one 1 oz glass of hard liquor (44 mL). Lifestyle  Work with your health care provider to maintain a healthy body weight or to lose weight. Ask what an ideal weight is for you. Get at least 30 minutes of exercise that causes your heart to beat faster (aerobic exercise) most days of the week. Activities may include walking, swimming, or biking. Include exercise to strengthen your muscles (resistance exercise), such as Pilates or lifting weights, as part of your weekly exercise routine. Try to do these types of exercises for 30 minutes at least 3 days a week. Do not use any products that contain nicotine or tobacco. These products include cigarettes, chewing tobacco, and vaping devices, such as e-cigarettes. If you need help quitting, ask your health care provider. Monitor your blood pressure at home as told by your health care provider. Keep all follow-up visits. This is important. Medicines Take over-the-counter and prescription medicines only as told by your health care provider. Follow directions carefully. Blood  pressure medicines must be taken as prescribed. Do not skip doses of blood pressure medicine. Doing this puts you at risk for problems and can make the medicine less effective. Ask your health care provider about side effects or reactions to medicines that you should watch for. Contact a health care provider if you: Think you are having a reaction to a medicine you are taking. Have headaches that keep coming back (recurring). Feel dizzy. Have swelling in your ankles. Have trouble with your vision. Get help right away if you: Develop a severe headache or confusion. Have unusual weakness or numbness. Feel faint. Have severe pain in your chest or abdomen. Vomit repeatedly. Have trouble breathing. These symptoms may be an emergency. Get help right away. Call 911. Do not wait to see if the symptoms will go away. Do not drive yourself to the hospital. Summary Hypertension is when the force of blood pumping through your arteries is too strong. If this condition is not controlled, it may put you at risk for serious complications. Your personal target blood pressure may vary depending on your medical conditions, your age, and other factors. For most people, a normal blood pressure is less than 120/80. Hypertension is treated with lifestyle changes, medicines, or a combination of both. Lifestyle changes include losing weight, eating a healthy,   low-sodium diet, exercising more, and limiting alcohol. This information is not intended to replace advice given to you by your health care provider. Make sure you discuss any questions you have with your health care provider. Document Revised: 08/19/2021 Document Reviewed: 08/19/2021 Elsevier Patient Education  2023 Elsevier Inc.  

## 2022-11-04 LAB — BMP8+EGFR
BUN/Creatinine Ratio: 14 (ref 9–23)
BUN: 10 mg/dL (ref 6–24)
CO2: 23 mmol/L (ref 20–29)
Calcium: 9.3 mg/dL (ref 8.7–10.2)
Chloride: 101 mmol/L (ref 96–106)
Creatinine, Ser: 0.71 mg/dL (ref 0.57–1.00)
Glucose: 81 mg/dL (ref 70–99)
Potassium: 4 mmol/L (ref 3.5–5.2)
Sodium: 138 mmol/L (ref 134–144)
eGFR: 105 mL/min/{1.73_m2} (ref >59)

## 2023-01-17 ENCOUNTER — Other Ambulatory Visit: Payer: Self-pay | Admitting: Family

## 2023-01-17 DIAGNOSIS — E785 Hyperlipidemia, unspecified: Secondary | ICD-10-CM

## 2023-01-17 DIAGNOSIS — Z8673 Personal history of transient ischemic attack (TIA), and cerebral infarction without residual deficits: Secondary | ICD-10-CM

## 2023-02-04 ENCOUNTER — Encounter: Payer: Self-pay | Admitting: Family

## 2023-02-04 ENCOUNTER — Ambulatory Visit (INDEPENDENT_AMBULATORY_CARE_PROVIDER_SITE_OTHER): Payer: Managed Care, Other (non HMO) | Admitting: Family

## 2023-02-04 DIAGNOSIS — G8929 Other chronic pain: Secondary | ICD-10-CM

## 2023-02-04 DIAGNOSIS — M5440 Lumbago with sciatica, unspecified side: Secondary | ICD-10-CM

## 2023-02-04 MED ORDER — PREDNISONE 10 MG (21) PO TBPK: ORAL_TABLET | ORAL | 0 refills | Status: DC

## 2023-02-04 MED ORDER — BACLOFEN 10 MG PO TABS: 10.0000 mg | ORAL_TABLET | Freq: Three times a day (TID) | ORAL | 0 refills | Status: DC

## 2023-02-04 MED ORDER — DICLOFENAC SODIUM 75 MG PO TBEC: 75.0000 mg | DELAYED_RELEASE_TABLET | Freq: Two times a day (BID) | ORAL | 0 refills | Status: DC

## 2023-02-04 NOTE — Patient Instructions (Signed)

## 2023-02-04 NOTE — Progress Notes (Signed)
Subjective:    Patient ID: Ann Anderson, female    DOB: June 04, 1976, 47 y.o.   MRN: [id number]  Chief Complaint  Patient presents with   Back Pain   Pt presents to the office today with chronic lumbar pain. Reports she has had this for years, but flares up. She reports this flare up happened a month ago and has worsen.  Back Pain This is a recurrent problem. The current episode started 1 to 4 weeks ago. The problem occurs constantly. The problem has been gradually worsening since onset. The pain is present in the lumbar spine. The quality of the pain is described as aching. The pain is at a severity of 8/10. The pain is moderate. Associated symptoms include leg pain and weakness. Risk factors include obesity. She has tried NSAIDs and muscle relaxant for the symptoms. The treatment provided mild relief.      Review of Systems  Musculoskeletal:  Positive for back pain.  Neurological:  Positive for weakness.  All other systems reviewed and are negative.      Objective:   Physical Exam Vitals reviewed.  Constitutional:      General: She is not in acute distress.    Appearance: She is well-developed. She is obese.  HENT:     Head: Normocephalic and atraumatic.     Right Ear: Tympanic membrane normal.     Left Ear: Tympanic membrane normal.  Eyes:     Pupils: Pupils are equal, round, and reactive to light.  Neck:     Thyroid: No thyromegaly.  Cardiovascular:     Rate and Rhythm: Normal rate and regular rhythm.     Heart sounds: Normal heart sounds. No murmur heard. Pulmonary:     Effort: Pulmonary effort is normal. No respiratory distress.     Breath sounds: Normal breath sounds. No wheezing.  Abdominal:     General: Bowel sounds are normal. There is no distension.     Palpations: Abdomen is soft.     Tenderness: There is no abdominal tenderness.  Musculoskeletal:        General: Tenderness present. Normal range of motion.     Cervical back: Normal range of  motion and neck supple.     Comments: Pain in lumbar with flexion and extension  Skin:    General: Skin is warm and dry.  Neurological:     Mental Status: She is alert and oriented to person, place, and time.     Cranial Nerves: No cranial nerve deficit.     Deep Tendon Reflexes: Reflexes are normal and symmetric.  Psychiatric:        Behavior: Behavior normal.        Thought Content: Thought content normal.        Judgment: Judgment normal.       BP 131/85   Pulse (!) 54   Temp (!) 97.3 F (36.3 C) (Temporal)   Ht 5' 4.5" (1.638 m)   Wt 184 lb 9.6 oz (83.7 kg)   SpO2 96%   BMI 31.20 kg/m      Assessment & Plan:   Ann Anderson comes in today with chief complaint of Back Pain   Diagnosis and orders addressed:  1. Chronic low back pain with sciatica, sciatica laterality unspecified, unspecified back pain laterality ROM exercises  Sedation precautions with baclofen Start diclofenac BID with food for 7-10 days  Follow up if symptoms worsen or do not improve  - baclofen (LIORESAL) 10 MG tablet;  Take 1 tablet (10 mg total) by mouth 3 (three) times daily.  Dispense: 30 each; Refill: 0 - diclofenac (VOLTAREN) 75 MG EC tablet; Take 1 tablet (75 mg total) by mouth 2 (two) times daily.  Dispense: 30 tablet; Refill: 0 - predniSONE (STERAPRED UNI-PAK 21 TAB) 10 MG (21) TBPK tablet; Use as directed  Dispense: 21 tablet; Refill: 0 - DG Lumbar Spine 2-3 Views  [name], FNP

## 2023-02-08 ENCOUNTER — Ambulatory Visit (INDEPENDENT_AMBULATORY_CARE_PROVIDER_SITE_OTHER): Payer: Managed Care, Other (non HMO)

## 2023-02-08 DIAGNOSIS — M5440 Lumbago with sciatica, unspecified side: Secondary | ICD-10-CM

## 2023-02-08 DIAGNOSIS — G8929 Other chronic pain: Secondary | ICD-10-CM

## 2023-03-18 ENCOUNTER — Other Ambulatory Visit: Payer: Self-pay | Admitting: Family

## 2023-03-18 DIAGNOSIS — G8929 Other chronic pain: Secondary | ICD-10-CM

## 2023-03-18 MED ORDER — BACLOFEN 10 MG PO TABS: 10.0000 mg | ORAL_TABLET | Freq: Three times a day (TID) | ORAL | 1 refills | Status: DC

## 2023-03-18 NOTE — Telephone Encounter (Signed)
Prescription sent to pharmacy.

## 2023-03-18 NOTE — Telephone Encounter (Signed)
From: Ellard Artis To: Office of [place], [place] Sent: 03/18/2023 1:34 AM EDT Subject: Medication Renewal Request  Refills have been requested for the following medications:   baclofen (LIORESAL) 10 MG tablet [Cheris Tweten]  Preferred pharmacy: [facility] [id number] - [place], [place] - [address] Delivery method: [employer]

## 2023-05-04 ENCOUNTER — Ambulatory Visit: Payer: Managed Care, Other (non HMO) | Admitting: Family

## 2023-07-02 ENCOUNTER — Telehealth (INDEPENDENT_AMBULATORY_CARE_PROVIDER_SITE_OTHER): Payer: Self-pay | Admitting: Family

## 2023-07-02 ENCOUNTER — Encounter: Payer: Self-pay | Admitting: Family

## 2023-07-02 DIAGNOSIS — L239 Allergic contact dermatitis, unspecified cause: Secondary | ICD-10-CM

## 2023-07-02 MED ORDER — TRIAMCINOLONE ACETONIDE 0.5 % EX OINT
1.0000 | TOPICAL_OINTMENT | Freq: Two times a day (BID) | CUTANEOUS | 0 refills | Status: DC
Start: 2023-07-02 — End: 2024-02-01

## 2023-07-02 MED ORDER — TRIAMCINOLONE ACETONIDE 0.5 % EX OINT: 1.0000 | TOPICAL_OINTMENT | Freq: Two times a day (BID) | CUTANEOUS | 0 refills | Status: DC

## 2023-07-02 MED ORDER — PREDNISONE 10 MG (21) PO TBPK: ORAL_TABLET | ORAL | 0 refills | Status: DC

## 2023-07-02 MED ORDER — PREDNISONE 10 MG (21) PO TBPK
ORAL_TABLET | ORAL | 0 refills | Status: AC
Start: 2023-07-02 — End: ?

## 2023-07-02 NOTE — Progress Notes (Signed)
Virtual Visit Consent   Ann Anderson, you are scheduled for a virtual visit with a [facility] provider today. Just as with appointments in the office, your consent must be obtained to participate. Your consent will be active for this visit and any virtual visit you may have with one of our providers in the next 365 days. If you have a MyChart account, a copy of this consent can be sent to you electronically.  As this is a virtual visit, video technology does not allow for your provider to perform a traditional examination. This may limit your provider's ability to fully assess your condition. If your provider identifies any concerns that need to be evaluated in person or the need to arrange testing (such as labs, EKG, etc.), we will make arrangements to do so. Although advances in technology are sophisticated, we cannot ensure that it will always work on either your end or our end. If the connection with a video visit is poor, the visit may have to be switched to a telephone visit. With either a video or telephone visit, we are not always able to ensure that we have a secure connection.  By engaging in this virtual visit, you consent to the provision of healthcare and authorize for your insurance to be billed (if applicable) for the services provided during this visit. Depending on your insurance coverage, you may receive a charge related to this service.  I need to obtain your verbal consent now. Are you willing to proceed with your visit today? Ann Anderson has provided verbal consent on 07/02/2023 for a virtual visit (video or telephone). [name], FNP  Date: 07/02/2023 3:08 PM  Virtual Visit via Video Note   I, [name], [name]  ([id number], Mar 18, 1976) on 07/02/23 at  2:25 PM EDT by a video-enabled telemedicine application and verified that I am speaking with the correct person using two identifiers.  Location: Patient: Virtual  Visit Location Patient: Home Provider: Virtual Visit Location Provider: Home Office   I discussed the limitations of evaluation and management by telemedicine and the availability of in person appointments. The patient expressed understanding and agreed to proceed.    History of Present Illness: Ann Anderson is a 47 y.o. who identifies as a female who was assigned female at birth, and is being seen today for right calf redness and redness on left lower leg. She reports she noticed these "red spots" on bilateral legs and thighs. Denies any insect bite, fevers, or N&V.   HPI: Rash This is a new problem. The current episode started in the past 7 days. The problem has been gradually worsening since onset. The affected locations include the right lower leg, right upper leg, left lower leg and left upper leg. The rash is characterized by redness, itchiness, pain and burning. She was exposed to nothing. Pertinent negatives include no congestion, cough, diarrhea, shortness of breath, sore throat or vomiting. Treatments tried: benadryl. The treatment provided mild relief.    Problems:  Patient Active Problem List   Diagnosis Date Noted   Unobtainable family history due to adoption 06/30/2022   Overactive bladder 10/08/2021   Essential hypertension 10/08/2021   Stress incontinence of urine 05/13/2021   MDD (major depressive disorder), recurrent episode, moderate (HCC) 12/26/2018   Tobacco abuse 04/06/2018   Venous insufficiency 02/23/2017   Neuropathy 11/13/2016   History of TIAs 10/21/2016   HLD (hyperlipidemia) 06/16/2016   Arthritis, lumbar spine 06/16/2016  Allergies:  Allergies  Allergen Reactions   Hydralazine Hives   Nitrofurantoin Hives   Medications:  Current Outpatient Medications:    atorvastatin (LIPITOR) 20 MG tablet, TAKE 1 TABLET BY MOUTH DAILY, Disp: 90 tablet, Rfl: 1   clopidogrel (PLAVIX) 75 MG tablet, TAKE 1 TABLET BY MOUTH DAILY, Disp: 90 tablet, Rfl: 1    diclofenac (VOLTAREN) 75 MG EC tablet, Take 1 tablet (75 mg total) by mouth 2 (two) times daily., Disp: 30 tablet, Rfl: 0   hydrochlorothiazide (HYDRODIURIL) 25 MG tablet, Take 1 tablet (25 mg total) by mouth daily., Disp: 90 tablet, Rfl: 3   meclizine (ANTIVERT) 25 MG tablet, Take 1 tablet (25 mg total) by mouth 3 (three) times daily as needed for dizziness., Disp: 90 tablet, Rfl: 1   Multiple Vitamin (MULTIVITAMIN) tablet, Take 1 tablet by mouth daily., Disp: , Rfl:    norethindrone (ORTHO MICRONOR) 0.35 MG tablet, Take 1 tablet (0.35 mg total) by mouth daily., Disp: 90 tablet, Rfl: 4   oxybutynin (DITROPAN-XL) 10 MG 24 hr tablet, Take 1 tablet (10 mg total) by mouth at bedtime., Disp: 90 tablet, Rfl: 2   pantoprazole (PROTONIX) 20 MG tablet, Take by mouth., Disp: , Rfl:    predniSONE (STERAPRED UNI-PAK 21 TAB) 10 MG (21) TBPK tablet, Use as directed, Disp: 21 tablet, Rfl: 0   topiramate (TOPAMAX) 50 MG tablet, TAKE ONE TABLET BY MOUTH AT BEDTIME, Disp: 75 tablet, Rfl: 0   triamcinolone ointment (KENALOG) 0.5 %, Apply 1 Application topically 2 (two) times daily., Disp: 30 g, Rfl: 0   vitamin E 100 UNIT capsule, Take 100 Units by mouth daily., Disp: , Rfl:   Observations/Objective: Patient is well-developed, well-nourished in no acute distress.  Resting comfortably  at home.  Head is normocephalic, atraumatic.  No labored breathing.  Speech is clear and coherent with logical content.  Patient is alert and oriented at baseline.  Bilateral legs erythemas See picture in MyChart  Assessment and Plan: 1. Allergic contact dermatitis, unspecified trigger - predniSONE (STERAPRED UNI-PAK 21 TAB) 10 MG (21) TBPK tablet; Use as directed  Dispense: 21 tablet; Refill: 0 - triamcinolone ointment (KENALOG) 0.5 %; Apply 1 Application topically 2 (two) times daily.  Dispense: 30 g; Refill: 0  Avoid scratching  Keep clean and dry Kenalog BID  Start prednisone  Follow up if symptoms worsen or do not  improve   Follow Up Instructions: I discussed the assessment and treatment plan with the patient. The patient was provided an opportunity to ask questions and all were answered. The patient agreed with the plan and demonstrated an understanding of the instructions.  A copy of instructions were sent to the patient via MyChart unless otherwise noted below.    The patient was advised to call back or seek an in-person evaluation if the symptoms worsen or if the condition fails to improve as anticipated.  Time:  I spent 8 minutes with the patient via telehealth technology discussing the above problems/concerns.    [name], FNP

## 2023-07-15 ENCOUNTER — Other Ambulatory Visit: Payer: Self-pay | Admitting: Family

## 2023-07-15 DIAGNOSIS — E785 Hyperlipidemia, unspecified: Secondary | ICD-10-CM

## 2023-07-15 DIAGNOSIS — Z8673 Personal history of transient ischemic attack (TIA), and cerebral infarction without residual deficits: Secondary | ICD-10-CM

## 2023-07-20 ENCOUNTER — Other Ambulatory Visit: Payer: Self-pay | Admitting: *Deleted

## 2023-07-20 DIAGNOSIS — N939 Abnormal uterine and vaginal bleeding, unspecified: Secondary | ICD-10-CM

## 2023-07-26 MED ORDER — NORETHINDRONE 0.35 MG PO TABS
1.0000 | ORAL_TABLET | Freq: Every day | ORAL | 0 refills | Status: DC
Start: 2023-07-26 — End: 2023-07-29

## 2023-07-26 NOTE — Telephone Encounter (Signed)
Ok to refill 

## 2023-07-29 ENCOUNTER — Ambulatory Visit (INDEPENDENT_AMBULATORY_CARE_PROVIDER_SITE_OTHER): Payer: Self-pay | Admitting: Obstetrics & Gynecology

## 2023-07-29 ENCOUNTER — Encounter: Payer: Self-pay | Admitting: Obstetrics & Gynecology

## 2023-07-29 VITALS — BP 131/86 | HR 80 | Ht 64.75 in | Wt 181.2 lb

## 2023-07-29 DIAGNOSIS — N939 Abnormal uterine and vaginal bleeding, unspecified: Secondary | ICD-10-CM

## 2023-07-29 MED ORDER — NORETHINDRONE ACETATE 5 MG PO TABS
5.0000 mg | ORAL_TABLET | Freq: Every day | ORAL | 3 refills | Status: DC
Start: 2023-07-29 — End: 2023-12-02

## 2023-07-29 NOTE — Progress Notes (Signed)
WELL-WOMAN EXAMINATION Patient name: Ann Anderson MRN [id number]  Date of birth: 1976/03/14 Chief Complaint:   Gynecologic Exam  History of Present Illness:   Ann Anderson is a 47 y.o. G3P3 female being seen today for a routine well-woman exam and the following concerns:  She has been on POPs to help regular her irregular periods.  She is still taking the pill, but the last 3-63mos it has been every 2 weeks.  Typically the bleeding will last for about 5 days- heavy on the first day with passage of clots.  On that day, every 1-2 hours (short time) then improves.  Slight itching, but not too bad.  No discharge, no odor.  Not currently sexually active.  No pelvic or abdominal pain.   No prior C-section.  Only BTL.  No LMP recorded. (Menstrual status: Oral contraceptives).  The current method of family planning is tubal ligation.    Last pap 02/2022.  Last mammogram: 08/2022. Last colonoscopy: followed by PCP     07/29/2023    9:54 AM 02/04/2023    8:40 AM 11/03/2022   11:17 AM 09/29/2022   10:27 AM 05/28/2022    8:59 AM  Depression screen PHQ 2/9  Decreased Interest 1 1 1 1 1   Down, Depressed, Hopeless 1 1 1 1 1   PHQ - 2 Score 2 2 2 2 2   Altered sleeping 1 2 2 2 1   Tired, decreased energy 1 1 1 1 1   Change in appetite 1 2  1  0  Feeling bad or failure about yourself  1 1 1 1 1   Trouble concentrating 1  1 1 1   Moving slowly or fidgety/restless 0 0 0 0 1  Suicidal thoughts 0 0 0 0 0  PHQ-9 Score 7 8 7 8 7   Difficult doing work/chores  Not difficult at all Not difficult at all Not difficult at all Somewhat difficult      Review of Systems:   Pertinent items are noted in HPI Denies any headaches, blurred vision, fatigue, shortness of breath, chest pain, abdominal pain, bowel movements, urination, or intercourse unless otherwise stated above.  Pertinent History Reviewed:  Reviewed past medical,surgical, social and family history.  Reviewed problem list,  medications and allergies. Physical Assessment:   Vitals:   07/29/23 0955  BP: 131/86  Pulse: 80  Weight: 181 lb 3.2 oz (82.2 kg)  Height: 5' 4.75" (1.645 m)  Body mass index is 30.39 kg/m.        Physical Examination:   General appearance - well appearing, and in no distress  Mental status - alert, oriented to person, place, and time  Psych:  She has a normal mood and affect  Skin - warm and dry, normal color, no suspicious lesions noted  Chest - effort normal, all lung fields clear to auscultation bilaterally  Heart - normal rate and regular rhythm  Neck:  midline trachea, no thyromegaly or nodules  Breasts - breasts appear normal, no suspicious masses, no skin or nipple changes or  axillary nodes  Abdomen - soft, nontender, nondistended, no masses or organomegaly  Pelvic - VULVA: normal appearing vulva with no masses, tenderness or lesions  VAGINA: normal appearing vagina with normal color and discharge, no lesions  CERVIX: normal appearing cervix without discharge or lesions, no CMT  UTERUS: uterus is felt to be normal size, shape, consistency and nontender, mobile  ADNEXA: No adnexal masses or tenderness noted.  Extremities:  No swelling or varicosities noted  Chaperone:  pt declined     Prior pelvic US completed 12/2021:    Assessment & Plan:  1) Well-Woman Exam -pap up todate, reviewed screening guidelines -mammogram up to date, ordered for November  2) AUB -due to insurance, will hold off on EMB []  plan to obtain at next appt -will transition from low dose POP to Aygestin 5mg  -briefly discussed alternative options including Mirena, endometrial ablation or hysterectomy -Discussed risk benefit and recovery of each option.  Patient wishes to do some research on her own and once her insurance is kicked and she will follow-up to discuss the plan  Orders Placed This Encounter  Procedures   MM 3D SCREENING MAMMOGRAM BILATERAL BREAST   Meds ordered this encounter   Medications   norethindrone (AYGESTIN) 5 MG tablet    Sig: Take 1 tablet (5 mg total) by mouth daily.    Dispense:  30 tablet    Refill:  3     Follow-up: Return in about 4 months (around 11/29/2023) for Medication follow up.   Myna Hidalgo, DO Attending Obstetrician & Gynecologist, [facility] for [employer], [facility]

## 2023-08-30 ENCOUNTER — Encounter (INDEPENDENT_AMBULATORY_CARE_PROVIDER_SITE_OTHER): Payer: Self-pay

## 2023-08-30 DIAGNOSIS — M542 Cervicalgia: Secondary | ICD-10-CM

## 2023-08-30 DIAGNOSIS — M546 Pain in thoracic spine: Secondary | ICD-10-CM

## 2023-09-03 MED ORDER — BACLOFEN 10 MG PO TABS: 10.0000 mg | ORAL_TABLET | Freq: Three times a day (TID) | ORAL | 0 refills | Status: DC | PRN

## 2023-09-03 MED ORDER — PREDNISONE 20 MG PO TABS
40.0000 mg | ORAL_TABLET | Freq: Every day | ORAL | 0 refills | Status: AC
Start: 2023-09-03 — End: 2023-09-08

## 2023-09-03 NOTE — Telephone Encounter (Signed)
Hello, Continue your diclofenac twice a day, baclofen three times a day as needed, and prednisone. Be careful when taking baclofen as needed. Congrats on the new grandbaby.   [name], FNP  Approximately 5 minutes was spent documenting and reviewing patient's chart.

## 2023-12-02 ENCOUNTER — Other Ambulatory Visit: Payer: Self-pay | Admitting: *Deleted

## 2023-12-02 DIAGNOSIS — N939 Abnormal uterine and vaginal bleeding, unspecified: Secondary | ICD-10-CM

## 2023-12-02 MED ORDER — NORETHINDRONE ACETATE 5 MG PO TABS: 5.0000 mg | ORAL_TABLET | Freq: Every day | ORAL | 3 refills | Status: DC

## 2023-12-07 ENCOUNTER — Encounter: Payer: Self-pay | Admitting: *Deleted

## 2023-12-20 ENCOUNTER — Ambulatory Visit: Payer: Self-pay | Admitting: Family

## 2023-12-22 ENCOUNTER — Encounter: Payer: Self-pay | Admitting: Family

## 2023-12-29 ENCOUNTER — Telehealth: Payer: Self-pay | Admitting: Family

## 2023-12-29 NOTE — Telephone Encounter (Signed)
 LMTCB   Copied from CRM [id number]. Topic: General - Other >> Dec 28, 2023  4:17 PM Antony Haste wrote: Reason for CRM: PT is requesting someone from the office to give her a direct callback regarding the message she sent to her provider recently.  Callback #: [phone]

## 2024-02-01 ENCOUNTER — Ambulatory Visit (INDEPENDENT_AMBULATORY_CARE_PROVIDER_SITE_OTHER)

## 2024-02-01 ENCOUNTER — Ambulatory Visit: Payer: Self-pay | Admitting: Family

## 2024-02-01 ENCOUNTER — Encounter: Payer: Self-pay | Admitting: Family

## 2024-02-01 VITALS — BP 148/86 | HR 77 | Temp 98.6°F | Ht 64.75 in | Wt 189.0 lb

## 2024-02-01 DIAGNOSIS — G5730 Lesion of lateral popliteal nerve, unspecified lower limb: Secondary | ICD-10-CM | POA: Diagnosis not present

## 2024-02-01 DIAGNOSIS — I10 Essential (primary) hypertension: Secondary | ICD-10-CM | POA: Diagnosis not present

## 2024-02-01 DIAGNOSIS — Z1211 Encounter for screening for malignant neoplasm of colon: Secondary | ICD-10-CM

## 2024-02-01 DIAGNOSIS — Z8673 Personal history of transient ischemic attack (TIA), and cerebral infarction without residual deficits: Secondary | ICD-10-CM

## 2024-02-01 DIAGNOSIS — M79605 Pain in left leg: Secondary | ICD-10-CM

## 2024-02-01 DIAGNOSIS — M542 Cervicalgia: Secondary | ICD-10-CM | POA: Diagnosis not present

## 2024-02-01 DIAGNOSIS — G629 Polyneuropathy, unspecified: Secondary | ICD-10-CM | POA: Diagnosis not present

## 2024-02-01 DIAGNOSIS — Z0001 Encounter for general adult medical examination with abnormal findings: Secondary | ICD-10-CM | POA: Diagnosis not present

## 2024-02-01 DIAGNOSIS — E785 Hyperlipidemia, unspecified: Secondary | ICD-10-CM

## 2024-02-01 DIAGNOSIS — M79604 Pain in right leg: Secondary | ICD-10-CM | POA: Diagnosis not present

## 2024-02-01 DIAGNOSIS — Z0000 Encounter for general adult medical examination without abnormal findings: Secondary | ICD-10-CM

## 2024-02-01 MED ORDER — GABAPENTIN 100 MG PO CAPS
100.0000 mg | ORAL_CAPSULE | Freq: Three times a day (TID) | ORAL | 3 refills | Status: DC
Start: 2024-02-01 — End: 2024-09-04

## 2024-02-01 MED ORDER — HYDROCHLOROTHIAZIDE 25 MG PO TABS: 25.0000 mg | ORAL_TABLET | Freq: Every day | ORAL | 3 refills | Status: DC

## 2024-02-01 MED ORDER — ATORVASTATIN CALCIUM 20 MG PO TABS: 20.0000 mg | ORAL_TABLET | Freq: Every day | ORAL | 2 refills | Status: DC

## 2024-02-01 MED ORDER — PREDNISONE 10 MG (21) PO TBPK: ORAL_TABLET | ORAL | 0 refills | Status: DC

## 2024-02-01 MED ORDER — CLOPIDOGREL BISULFATE 75 MG PO TABS: 75.0000 mg | ORAL_TABLET | Freq: Every day | ORAL | 2 refills | Status: DC

## 2024-02-01 MED ORDER — CYCLOBENZAPRINE HCL 10 MG PO TABS
10.0000 mg | ORAL_TABLET | Freq: Three times a day (TID) | ORAL | 0 refills | Status: DC | PRN
Start: 2024-02-01 — End: 2024-06-23

## 2024-02-01 NOTE — Progress Notes (Signed)
 Subjective:    Patient ID: Ann Anderson, female    DOB: 16-Jul-1976, 48 y.o.   MRN: [id number]  Chief Complaint  Patient presents with   Leg Pain   Neck Pain   PT presents to the office today with neck pain for the last few months and has worsen.    Leg Pain  The incident occurred more than 1 week ago. There was no injury mechanism. The pain is present in the left thigh and right thigh. The pain is at a severity of 10/10. The pain is moderate. Associated symptoms include numbness and tingling. She reports no foreign bodies present.  Neck Pain  This is a new problem. The current episode started more than 1 month ago. The problem occurs intermittently. The pain is associated with nothing. The pain is present in the left side, right side and occipital region. The quality of the pain is described as cramping and shooting. The pain is at a severity of 6/10. The pain is moderate. The symptoms are aggravated by bending and twisting. Associated symptoms include leg pain, numbness and tingling. Pertinent negatives include no photophobia or visual change. She has tried bed rest, muscle relaxants and NSAIDs for the symptoms. The treatment provided mild relief.  Hypertension This is a chronic problem. The current episode started more than 1 year ago. The problem has been waxing and waning since onset. Associated symptoms include malaise/fatigue and neck pain. Pertinent negatives include no peripheral edema or shortness of breath. Risk factors for coronary artery disease include dyslipidemia and sedentary lifestyle. The current treatment provides moderate improvement.  Hyperlipidemia This is a chronic problem. The current episode started more than 1 year ago. Associated symptoms include leg pain. Pertinent negatives include no shortness of breath. Current antihyperlipidemic treatment includes statins. The current treatment provides mild improvement of lipids.      Review of Systems   Constitutional:  Positive for malaise/fatigue.  Eyes:  Negative for photophobia.  Respiratory:  Negative for shortness of breath.   Musculoskeletal:  Positive for neck pain.  Neurological:  Positive for tingling and numbness.  All other systems reviewed and are negative.   Social History   Socioeconomic History   Marital status: Married    Spouse name: Not on file   Number of children: 1   Years of education: Not on file   Highest education level: Not on file  Occupational History   Occupation: [employer]  Tobacco Use   Smoking status: Every Day    Current packs/day: 0.50    Average packs/day: 0.5 packs/day for 6.0 years (3.0 ttl pk-yrs)    Types: Cigarettes   Smokeless tobacco: Never   Tobacco comments:    Restarted 2 years ago and trying to cut back.   Vaping Use   Vaping status: Every Day   Substances: Nicotine  Substance and Sexual Activity   Alcohol use: Yes    Comment: Social   Drug use: Never   Sexual activity: Not Currently    Birth control/protection: Surgical  Other Topics Concern   Not on file  Social History Narrative   Right handed   Lives in a single story home with daughter and cousins   Works at [employer]   Caffeine - trying to cut back now mountain dew 2 cans/day; coffee 6-18 oz   Assoc. Degree   Exercise at work - walks   Social Drivers of Health   Financial Resource Strain: Medium Risk (07/29/2023)   Overall Physicist, medical  Strain (CARDIA)    Difficulty of Paying Living Expenses: Somewhat hard  Food Insecurity: No Food Insecurity (07/29/2023)   Hunger Vital Sign    Worried About Running Out of Food in the Last Year: Never true    Ran Out of Food in the Last Year: Never true  Transportation Needs: No Transportation Needs (07/29/2023)   PRAPARE - Administrator, Civil Service (Medical): No    Lack of Transportation (Non-Medical): No  Physical Activity: Insufficiently Active (07/29/2023)   Exercise Vital Sign    Days of  Exercise per Week: 3 days    Minutes of Exercise per Session: 30 min  Stress: Stress Concern Present (07/29/2023)   [employer] of Occupational Health - Occupational Stress Questionnaire    Feeling of Stress : Rather much  Social Connections: Moderately Integrated (07/29/2023)   Social Connection and Isolation Panel [NHANES]    Frequency of Communication with Friends and Family: More than three times a week    Frequency of Social Gatherings with Friends and Family: Twice a week    Attends Religious Services: 1 to 4 times per year    Active Member of [employer] or Organizations: No    Attends Engineer, structural: Never    Marital Status: Married   Family History  Problem Relation Age of Onset   Cancer Mother        breast   Heart disease Father    Dementia Father    Breast cancer Neg Hx         Objective:   Physical Exam Vitals reviewed.  Constitutional:      General: She is not in acute distress.    Appearance: She is well-developed.  HENT:     Head: Normocephalic and atraumatic.  Eyes:     Pupils: Pupils are equal, round, and reactive to light.  Neck:     Thyroid: No thyromegaly.  Cardiovascular:     Rate and Rhythm: Normal rate and regular rhythm.     Heart sounds: Normal heart sounds. No murmur heard. Pulmonary:     Effort: Pulmonary effort is normal. No respiratory distress.     Breath sounds: Normal breath sounds. No wheezing.  Abdominal:     General: Bowel sounds are normal. There is no distension.     Palpations: Abdomen is soft.     Tenderness: There is no abdominal tenderness.  Musculoskeletal:        General: Tenderness present.     Cervical back: Normal range of motion and neck supple.     Right lower leg: Edema (trace) present.     Left lower leg: Edema (trace) present.     Comments: Pain in bilateral posterior neck with rotation and flexion  Skin:    General: Skin is warm and dry.  Neurological:     Mental Status: She is alert and oriented  to person, place, and time.     Cranial Nerves: No cranial nerve deficit.     Deep Tendon Reflexes: Reflexes are normal and symmetric.  Psychiatric:        Behavior: Behavior normal.        Thought Content: Thought content normal.        Judgment: Judgment normal.     BP (!) 148/86   Pulse 77   Temp 98.6 F (37 C) (Temporal)   Ht 5' 4.75" (1.645 m)   Wt 189 lb (85.7 kg)   BMI 31.69 kg/m      Assessment &  Plan:  Sharyn Brilliant comes in today with chief complaint of Leg Pain and Neck Pain   Diagnosis and orders addressed:  1. Neck pain - predniSONE (STERAPRED UNI-PAK 21 TAB) 10 MG (21) TBPK tablet; Use as directed  Dispense: 21 tablet; Refill: 0 - cyclobenzaprine (FLEXERIL) 10 MG tablet; Take 1 tablet (10 mg total) by mouth 3 (three) times daily as needed for muscle spasms.  Dispense: 30 tablet; Refill: 0 - DG Cervical Spine Complete; Future - CMP14+EGFR  2. Bilateral leg pain - gabapentin (NEURONTIN) 100 MG capsule; Take 1 capsule (100 mg total) by mouth 3 (three) times daily.  Dispense: 90 capsule; Refill: 3 - CMP14+EGFR - TSH - Vitamin B12  3. Peroneal neuropathy, unspecified laterality - gabapentin (NEURONTIN) 100 MG capsule; Take 1 capsule (100 mg total) by mouth 3 (three) times daily.  Dispense: 90 capsule; Refill: 3 - CMP14+EGFR - TSH - Vitamin B12  4. Annual physical exam (Primary) - Lipid panel - CBC with Differential/Platelet  5. Essential hypertension  6. Neuropathy  7. Hyperlipidemia, unspecified hyperlipidemia type - atorvastatin (LIPITOR) 20 MG tablet; Take 1 tablet (20 mg total) by mouth daily. **NEEDS TO BE SEEN BEFORE NEXT REFILL**  Dispense: 90 tablet; Refill: 2  8. History of TIAs - clopidogrel (PLAVIX) 75 MG tablet; Take 1 tablet (75 mg total) by mouth daily. **NEEDS TO BE SEEN BEFORE NEXT REFILL**  Dispense: 90 tablet; Refill: 2  9. Primary hypertension - hydrochlorothiazide (HYDRODIURIL) 25 MG tablet; Take 1 tablet (25 mg total)  by mouth daily.  Dispense: 90 tablet; Refill: 3  10. Colon cancer screening - Ambulatory referral to Gastroenterology   Labs pending Will given prednisone and flexeril  X-ray pending  Will start gabapentin 100 mg TID prn  Referral to GI pending for colonoscopy  Continue current medications  Keep follow up with specialists  Health Maintenance reviewed Diet and exercise encouraged  Return in about 1 month (around 03/02/2024), or if symptoms worsen or fail to improve.    [name], FNP

## 2024-02-01 NOTE — Patient Instructions (Signed)
 Peripheral Neuropathy Peripheral neuropathy is a type of nerve damage. It affects nerves that carry signals between the spinal cord and the arms, legs, and the rest of the body (peripheral nerves). It does not affect nerves in the spinal cord or brain. In peripheral neuropathy, one nerve or a group of nerves may be damaged. Peripheral neuropathy is a broad category that includes many specific nerve disorders, like diabetic neuropathy, hereditary neuropathy, and carpal tunnel syndrome. What are the causes? This condition may be caused by: Certain diseases, such as: Diabetes. This is the most common cause of peripheral neuropathy. Autoimmune diseases, such as rheumatoid arthritis and systemic lupus erythematosus. Nerve diseases that are passed from parent to child (inherited). Kidney disease. Thyroid disease. Other causes may include: Nerve injury. Pressure or stress on a nerve that lasts a long time. Lack (deficiency) of B vitamins. This can result from alcoholism, poor diet, or a restricted diet. Infections. Some medicines, such as cancer medicines (chemotherapy). Poisonous (toxic) substances, such as lead and mercury. Too little blood flowing to the legs. In some cases, the cause of this condition is not known. What are the signs or symptoms? Symptoms of this condition depend on which of your nerves is damaged. Symptoms in the legs, hands, and arms can include: Loss of feeling (numbness) in the feet, hands, or both. Tingling in the feet, hands, or both. Burning pain. Very sensitive skin. Weakness. Not being able to move a part of the body (paralysis). Clumsiness or poor coordination. Muscle twitching. Loss of balance. Symptoms in other parts of the body can include: Not being able to control your bladder. Feeling dizzy. Sexual problems. How is this diagnosed? Diagnosing and finding the cause of peripheral neuropathy can be difficult. Your health care provider will take your  medical history and do a physical exam. A neurological exam will also be done. This involves checking things that are affected by your brain, spinal cord, and nerves (nervous system). For example, your health care provider will check your reflexes, how you move, and what you can feel. You may have other tests, such as: Blood tests. Electromyogram (EMG) and nerve conduction tests. These tests check nerve function and how well the nerves are controlling the muscles. Imaging tests, such as a CT scan or MRI, to rule out other causes of your symptoms. Removing a small piece of nerve to be examined in a lab (nerve biopsy). Removing and examining a small amount of the fluid that surrounds the brain and spinal cord (lumbar puncture). How is this treated? Treatment for this condition may involve: Treating the underlying cause of the neuropathy, such as diabetes, kidney disease, or vitamin deficiencies. Stopping medicines that can cause neuropathy, such as chemotherapy. Medicine to help relieve pain. Medicines may include: Prescription or over-the-counter pain medicine. Anti-seizure medicine. Antidepressants. Pain-relieving patches that are applied to painful areas of skin. Surgery to relieve pressure on a nerve or to destroy a nerve that is causing pain. Physical therapy to help improve movement and balance. Devices to help you move around (assistive devices). Follow these instructions at home: Medicines Take over-the-counter and prescription medicines only as told by your health care provider. Do not take any other medicines without first asking your health care provider. Ask your health care provider if the medicine prescribed to you requires you to avoid driving or using machinery. Lifestyle  Do not use any products that contain nicotine or tobacco. These products include cigarettes, chewing tobacco, and vaping devices, such as e-cigarettes. Smoking keeps  blood from reaching damaged nerves. If you  need help quitting, ask your health care provider. Avoid or limit alcohol. Too much alcohol can cause a vitamin B deficiency, and vitamin B is needed for healthy nerves. Eat a healthy diet. This includes: Eating foods that are high in fiber, such as beans, whole grains, and fresh fruits and vegetables. Limiting foods that are high in fat and processed sugars, such as fried or sweet foods. General instructions  If you have diabetes, work closely with your health care provider to keep your blood sugar under control. If you have numbness in your feet: Check every day for signs of injury or infection. Watch for redness, warmth, and swelling. Wear padded socks and comfortable shoes. These help protect your feet. Develop a good support system. Living with peripheral neuropathy can be stressful. Consider talking with a mental health specialist or joining a support group. Use assistive devices and attend physical therapy as told by your health care provider. This may include using a walker or a cane. Keep all follow-up visits. This is important. Where to find more information [employer] of Neurological Disorders: [employer] Contact a health care provider if: You have new signs or symptoms of peripheral neuropathy. You are struggling emotionally from dealing with peripheral neuropathy. Your pain is not well controlled. Get help right away if: You have an injury or infection that is not healing normally. You develop new weakness in an arm or leg. You have fallen or do so frequently. Summary Peripheral neuropathy is when the nerves in the arms or legs are damaged, resulting in numbness, weakness, or pain. There are many causes of peripheral neuropathy, including diabetes, pinched nerves, vitamin deficiencies, autoimmune disease, and hereditary conditions. Diagnosing and finding the cause of peripheral neuropathy can be difficult. Your health care provider will take your medical  history, do a physical exam, and do tests, including blood tests and nerve function tests. Treatment involves treating the underlying cause of the neuropathy and taking medicines to help control pain. Physical therapy and assistive devices may also help. This information is not intended to replace advice given to you by your health care provider. Make sure you discuss any questions you have with your health care provider. Document Revised: 06/17/2021 Document Reviewed: 06/17/2021 Elsevier Patient Education  2024 [employer].

## 2024-02-02 LAB — CBC WITH DIFFERENTIAL/PLATELET
Basophils Absolute: 0.1 10*3/uL (ref 0.0–0.2)
Basos: 1 %
EOS (ABSOLUTE): 0.2 10*3/uL (ref 0.0–0.4)
Eos: 3 %
Hematocrit: 43.7 % (ref 34.0–46.6)
Hemoglobin: 14.7 g/dL (ref 11.1–15.9)
Immature Grans (Abs): 0 10*3/uL (ref 0.0–0.1)
Immature Granulocytes: 0 %
Lymphocytes Absolute: 2.2 10*3/uL (ref 0.7–3.1)
Lymphs: 29 %
MCH: 29 pg (ref 26.6–33.0)
MCHC: 33.6 g/dL (ref 31.5–35.7)
MCV: 86 fL (ref 79–97)
Monocytes Absolute: 0.6 10*3/uL (ref 0.1–0.9)
Monocytes: 7 %
Neutrophils Absolute: 4.6 10*3/uL (ref 1.4–7.0)
Neutrophils: 60 %
Platelets: 297 10*3/uL (ref 150–450)
RBC: 5.07 x10E6/uL (ref 3.77–5.28)
RDW: 13.6 % (ref 11.7–15.4)
WBC: 7.7 10*3/uL (ref 3.4–10.8)

## 2024-02-02 LAB — CMP14+EGFR
ALT: 20 IU/L (ref 0–32)
AST: 16 IU/L (ref 0–40)
Albumin: 4.3 g/dL (ref 3.9–4.9)
Alkaline Phosphatase: 52 IU/L (ref 44–121)
BUN/Creatinine Ratio: 10 (ref 9–23)
BUN: 8 mg/dL (ref 6–24)
Bilirubin Total: 0.3 mg/dL (ref 0.0–1.2)
CO2: 24 mmol/L (ref 20–29)
Calcium: 9 mg/dL (ref 8.7–10.2)
Chloride: 104 mmol/L (ref 96–106)
Creatinine, Ser: 0.83 mg/dL (ref 0.57–1.00)
Globulin, Total: 2.5 g/dL (ref 1.5–4.5)
Glucose: 114 mg/dL — ABNORMAL HIGH (ref 70–99)
Potassium: 4.2 mmol/L (ref 3.5–5.2)
Sodium: 140 mmol/L (ref 134–144)
Total Protein: 6.8 g/dL (ref 6.0–8.5)
eGFR: 87 mL/min/{1.73_m2} (ref >59)

## 2024-02-02 LAB — VITAMIN B12: Vitamin B-12: 300 pg/mL (ref 232–1245)

## 2024-02-02 LAB — LIPID PANEL
Chol/HDL Ratio: 5.3 ratio — ABNORMAL HIGH (ref 0.0–4.4)
Cholesterol, Total: 163 mg/dL (ref 100–199)
HDL: 31 mg/dL — ABNORMAL LOW (ref >39)
LDL Chol Calc (NIH): 84 mg/dL (ref 0–99)
Triglycerides: 287 mg/dL — ABNORMAL HIGH (ref 0–149)
VLDL Cholesterol Cal: 48 mg/dL — ABNORMAL HIGH (ref 5–40)

## 2024-02-02 LAB — TSH: TSH: 2.01 u[IU]/mL (ref 0.450–4.500)

## 2024-02-07 LAB — SPECIMEN STATUS REPORT

## 2024-02-07 LAB — HGB A1C W/O EAG: Hgb A1c MFr Bld: 5.8 % — ABNORMAL HIGH (ref 4.8–5.6)

## 2024-02-08 ENCOUNTER — Other Ambulatory Visit: Payer: Self-pay | Admitting: Family

## 2024-02-08 DIAGNOSIS — R7303 Prediabetes: Secondary | ICD-10-CM

## 2024-02-14 ENCOUNTER — Telehealth: Payer: Self-pay | Admitting: *Deleted

## 2024-02-14 NOTE — Progress Notes (Signed)
 Care Guide Pharmacy Note  02/14/2024 Name: Laurine Kuyper MRN: [id number] DOB: 07-02-1976  Referred By: Yevette Hem, FNP Reason for referral: Complex Care Management (Outreach to schedule referral with pharmacist )   Adriene Knipfer is a 48 y.o. year old female who is a primary care patient of Yevette Hem, FNP.  Adaline Ada was referred to the pharmacist for assistance related to:  prediabetes education   Pt declined referral - says she has family members with DM and is educated on prevention - contact info given if services needed in the future   Kandis Ormond, CMA [facility]  [facility], [facility] Guide Direct Dial: [phone]  Fax: [phone] Website: [email or web address]

## 2024-03-07 ENCOUNTER — Ambulatory Visit: Admitting: Family

## 2024-04-06 ENCOUNTER — Encounter: Payer: Self-pay | Admitting: Family

## 2024-05-02 ENCOUNTER — Other Ambulatory Visit: Payer: Self-pay | Admitting: *Deleted

## 2024-05-02 ENCOUNTER — Other Ambulatory Visit: Payer: Self-pay | Admitting: Obstetrics & Gynecology

## 2024-05-02 DIAGNOSIS — N939 Abnormal uterine and vaginal bleeding, unspecified: Secondary | ICD-10-CM

## 2024-05-02 MED ORDER — NORETHINDRONE ACETATE 5 MG PO TABS: 5.0000 mg | ORAL_TABLET | Freq: Every day | ORAL | 3 refills | Status: DC

## 2024-05-02 NOTE — Progress Notes (Signed)
 Refill aygestin

## 2024-05-04 ENCOUNTER — Telehealth: Payer: Self-pay | Admitting: Obstetrics & Gynecology

## 2024-05-04 ENCOUNTER — Other Ambulatory Visit: Payer: Self-pay | Admitting: *Deleted

## 2024-05-04 DIAGNOSIS — N939 Abnormal uterine and vaginal bleeding, unspecified: Secondary | ICD-10-CM

## 2024-05-04 MED ORDER — NORETHINDRONE ACETATE 5 MG PO TABS: 5.0000 mg | ORAL_TABLET | Freq: Every day | ORAL | 3 refills | Status: DC

## 2024-05-04 NOTE — Telephone Encounter (Signed)
 Pt states her prescription of Aygestin  was sent to the wrong pharmacy. Her pharmacy is the [facility] in [place]

## 2024-05-09 ENCOUNTER — Telehealth: Payer: Self-pay | Admitting: Family

## 2024-05-09 NOTE — Telephone Encounter (Unsigned)
 Copied from CRM [id number]. Topic: General - Billing Inquiry >> May 09, 2024 10:13 AM Ann Anderson wrote: Reason for CRM: Pt received bill from last visit. Pt would like a call back regarding bill at  [phone] or contact via MyChart.

## 2024-05-09 NOTE — Telephone Encounter (Signed)
 Attempted to call patient about this bill

## 2024-05-10 ENCOUNTER — Ambulatory Visit: Admitting: Obstetrics & Gynecology

## 2024-05-10 ENCOUNTER — Encounter: Payer: Self-pay | Admitting: Obstetrics & Gynecology

## 2024-05-10 VITALS — BP 144/89 | HR 73 | Ht 64.75 in | Wt 189.0 lb

## 2024-05-10 DIAGNOSIS — I10 Essential (primary) hypertension: Secondary | ICD-10-CM | POA: Diagnosis not present

## 2024-05-10 DIAGNOSIS — Z720 Tobacco use: Secondary | ICD-10-CM

## 2024-05-10 DIAGNOSIS — N939 Abnormal uterine and vaginal bleeding, unspecified: Secondary | ICD-10-CM

## 2024-05-10 MED ORDER — NORETHINDRONE ACETATE 5 MG PO TABS: 5.0000 mg | ORAL_TABLET | Freq: Every day | ORAL | 3 refills | Status: DC

## 2024-05-10 NOTE — Telephone Encounter (Signed)
 Attempted to call patient

## 2024-05-10 NOTE — Progress Notes (Signed)
   GYN VISIT Patient name: Ann Anderson MRN [id number]  Date of birth: 03-Jul-1976 Chief Complaint:   Follow-up  History of Present Illness:   Ann Anderson is a 48 y.o. [phone] female being seen today for follow up regarding:  AUB: At her last visit she was transition from POPS to Aygestin .  She had not noted some improvement however the bleeding was occurring more frequently.  Since positioning to Aygestin  she notes that she does not have any bleeding while on this medication.  For some reason she is struggled with refills and may note some light dark spotting on the few days that she does not have a pill.  When she restarts the medication the bleeding resolves   Overall she is happy with this improvement.  She denies significant dysmenorrhea.  Denies vaginal itching discharge or irritation.  Denies pelvic or abdominal pain.  She is having some issues with her neck and back and has follow-up with PCP.  No LMP recorded. (Menstrual status: Oral contraceptives).    Review of Systems:   Pertinent items are noted in HPI Denies fever/chills, dizziness, headaches, visual disturbances, fatigue, shortness of breath, chest pain, abdominal pain, vomiting Pertinent History Reviewed:   Past Surgical History:  Procedure Laterality Date   TUBAL LIGATION  2005    Past Medical History:  Diagnosis Date   Degenerative disc disease, lumbar    Hyperlipidemia    Hypertension    Neuropathy    Reviewed problem list, medications and allergies. Physical Assessment:   Vitals:   05/10/24 0956 05/10/24 1012  BP: (!) 156/85 (!) 144/89  Pulse: 73   Weight: 189 lb (85.7 kg)   Height: 5' 4.75 (1.645 m)   Body mass index is 31.69 kg/m.       Physical Examination:   General appearance: alert, well appearing, and in no distress  Psych: mood appropriate, normal affect  Skin: warm & dry   Cardiovascular: normal heart rate noted  Respiratory: normal respiratory effort, no  distress  Abdomen: soft, non-tender, no rebound, no guarding  Pelvic: examination not indicated  Extremities: no edema, no calf tenderness bilateraly  Chaperone: N/A    Assessment & Plan:  1) AUB - Reviewed recommendation for EMB to rule out underlying etiology, due to social/personal issues at this time she wishes to postpone until possibly September - Doing well with this form of medication and plan to continue  Meds ordered this encounter  Medications   norethindrone  (AYGESTIN ) 5 MG tablet    Sig: Take 1 tablet (5 mg total) by mouth daily.    Dispense:  90 tablet    Refill:  3     Return for EMB (per pt on timing).   Arlee Santosuosso, DO Attending Obstetrician & Gynecologist, [facility] for [employer], [facility]

## 2024-05-12 NOTE — Telephone Encounter (Signed)
 Message sent through MyChart. Closing encounter

## 2024-05-15 ENCOUNTER — Other Ambulatory Visit: Payer: Self-pay | Admitting: Family

## 2024-06-23 ENCOUNTER — Encounter: Payer: Self-pay | Admitting: Family

## 2024-06-23 ENCOUNTER — Ambulatory Visit: Admitting: Family

## 2024-06-23 VITALS — BP 118/72 | HR 74 | Temp 97.8°F | Ht 64.75 in | Wt 190.4 lb

## 2024-06-23 DIAGNOSIS — R519 Headache, unspecified: Secondary | ICD-10-CM

## 2024-06-23 DIAGNOSIS — M5412 Radiculopathy, cervical region: Secondary | ICD-10-CM

## 2024-06-23 MED ORDER — DICLOFENAC SODIUM 75 MG PO TBEC: 75.0000 mg | DELAYED_RELEASE_TABLET | Freq: Two times a day (BID) | ORAL | 2 refills | Status: DC

## 2024-06-23 MED ORDER — BACLOFEN 10 MG PO TABS: 10.0000 mg | ORAL_TABLET | Freq: Three times a day (TID) | ORAL | 0 refills | Status: DC

## 2024-06-23 NOTE — Patient Instructions (Signed)
Cervical Radiculopathy  Cervical radiculopathy happens when a nerve in the neck (a cervical nerve) is pinched or bruised. This condition can happen because of an injury to the cervical spine (vertebrae) in the neck, or as part of the normal aging process. Pressure on the cervical nerves can cause pain or numbness that travels from the neck all the way down to the arm and fingers. This condition usually gets better with rest. Treatment may be needed if the condition does not improve. What are the causes? This condition may be caused by: A neck injury. A bulging (herniated) disk. Muscle spasms. Muscle tightness in the neck due to overuse. Arthritis. Breakdown or degeneration in the bones and joints of the spine (spondylosis) due to aging. Bone spurs that may develop near the cervical nerves. What are the signs or symptoms? Symptoms of this condition include: Pain. The pain may travel from the neck to the arm and hand. The pain can be severe or irritating. It may get worse when you move your neck. Numbness or tingling in your arm or hand. Weakness in the affected arm and hand, in severe cases. How is this diagnosed? This condition may be diagnosed based on your symptoms, your medical history, and a physical exam. You may also have tests, including: X-rays. CT scan. MRI. Electromyogram (EMG). Nerve conduction tests. How is this treated? In many cases, treatment is not needed for this condition. With rest, the condition usually gets better over time. If treatment is needed, options may include: Wearing a soft neck collar (cervical collar) for short periods of time. Doing physical therapy to strengthen your neck muscles. Taking medicines. These may include NSAIDs, such as ibuprofen, or oral corticosteroids. Having spinal injections, in severe cases. Having surgery. This may be needed if other treatments do not help. Different types of surgery may be done depending on the cause of this  condition. Follow these instructions at home: If you have a cervical collar: Wear it as told by your health care provider. Remove it only as told by your health care provider. Ask your health care provider if you can remove the cervical collar for cleaning and bathing. If you are allowed to remove the collar for cleaning or bathing: Follow instructions from your health care provider about how to remove the collar safely. Clean the collar by wiping it with mild soap and water and drying it completely. Take out any removable pads in the collar every 1-2 days, and wash them by hand with soap and water. Let them air-dry completely before you put them back in the collar. Check your skin under the collar for irritation or sores. If you see any, tell your health care provider. Managing pain     Take over-the-counter and prescription medicines only as told by your health care provider. If directed, put ice on the affected area. To do this: If you have a soft neck collar, remove it as told by your health care provider. Put ice in a plastic bag. Place a towel between your skin and the bag. Leave the ice on for 20 minutes, 2-3 times a day. Remove the ice if your skin turns bright red. This is very important. If you cannot feel pain, heat, or cold, you have a greater risk of damage to the area. If applying ice does not help, you can try using heat. Use the heat source that your health care provider recommends, such as a moist heat pack or a heating pad. Place a towel between   your skin and the heat source. Leave the heat on for 20-30 minutes. Remove the heat if your skin turns bright red. This is especially important if you are unable to feel pain, heat, or cold. You have a greater risk of getting burned. Try a gentle neck and shoulder massage to help relieve symptoms. Activity Rest as needed. Return to your normal activities as told by your health care provider. Ask your health care provider what  activities are safe for you. Do stretching and strengthening exercises as told by your health care provider or your physical therapist. You may have to avoid lifting. Ask your health care provider how much you can safely lift. General instructions Use a flat pillow when you sleep. Do not drive while wearing a cervical collar. If you do not have a cervical collar, ask your health care provider if it is safe to drive while your neck heals. Ask your health care provider if the medicine prescribed to you requires you to avoid driving or using machinery. Do not use any products that contain nicotine or tobacco. These products include cigarettes, chewing tobacco, and vaping devices, such as e-cigarettes. If you need help quitting, ask your health care provider. Keep all follow-up visits. This is important. Contact a health care provider if: Your condition does not improve with treatment. Get help right away if: Your pain gets much worse and is not controlled with medicines. You have weakness or numbness in your hand, arm, face, or leg. You have a high fever. You have a stiff, rigid neck. You lose control of your bowels or your bladder (have incontinence). You have trouble with walking, balance, or speaking. Summary Cervical radiculopathy happens when a nerve in the neck is pinched or bruised. A nerve can get pinched from a bulging disk, arthritis, muscle spasms, or an injury to the neck. Symptoms include pain, tingling, or numbness radiating from the neck to the arm or hand. Weakness can also occur in severe cases. Treatment may include rest, wearing a cervical collar, and physical therapy. Medicines may be prescribed to help with pain. In severe cases, injections or surgery may be needed. This information is not intended to replace advice given to you by your health care provider. Make sure you discuss any questions you have with your health care provider. Document Revised: 04/17/2021 Document  Reviewed: 04/17/2021 Elsevier Patient Education  2024 Elsevier Inc.  

## 2024-06-23 NOTE — Progress Notes (Signed)
 Subjective:    Patient ID: Ann Anderson, female    DOB: 08/10/1976, 48 y.o.   MRN: [id number]  Chief Complaint  Patient presents with   Acute Visit    Neck pain radiating down my back. Starting to give me headaches.    PT presents to the office today with bilateral neck pain that started in June and worsening. Reports it now is radiating down her back and causing headaches. Denies any injury.   She had a cervical x-ray 02/01/24 that showed, Subtle degenerative disc disease C4-C5 C5-C6 Neck Pain  This is a new problem. The current episode started more than 1 month ago. The problem occurs constantly. The problem has been gradually worsening. The pain is associated with nothing. The pain is present in the left side and right side. The quality of the pain is described as stabbing and aching. The pain is at a severity of 8/10. The pain is moderate. The symptoms are aggravated by twisting and bending. Associated symptoms include headaches and numbness (bilateral arms). Pertinent negatives include no fever, leg pain, pain with swallowing, paresis, tingling, trouble swallowing, visual change or weight loss. She has tried NSAIDs and bed rest for the symptoms. The treatment provided mild relief.      Review of Systems  Constitutional:  Negative for fever and weight loss.  HENT:  Negative for trouble swallowing.   Musculoskeletal:  Positive for neck pain.  Neurological:  Positive for numbness (bilateral arms) and headaches. Negative for tingling.  All other systems reviewed and are negative.   Social History   Socioeconomic History   Marital status: Married    Spouse name: Not on file   Number of children: 1   Years of education: Not on file   Highest education level: Not on file  Occupational History   Occupation: [employer]  Tobacco Use   Smoking status: Every Day    Current packs/day: 0.50    Average packs/day: 0.5 packs/day for 6.0 years (3.0 ttl pk-yrs)    Types:  Cigarettes   Smokeless tobacco: Never   Tobacco comments:    Restarted 2 years ago and trying to cut back.   Vaping Use   Vaping status: Every Day   Substances: Nicotine  Substance and Sexual Activity   Alcohol use: Yes    Comment: Social   Drug use: Never   Sexual activity: Not Currently    Birth control/protection: Surgical  Other Topics Concern   Not on file  Social History Narrative   Right handed   Lives in a single story home with daughter and cousins   Works at [employer]   Caffeine - trying to cut back now mountain dew 2 cans/day; coffee 6-18 oz   Assoc. Degree   Exercise at work - walks   Social Drivers of Health   Financial Resource Strain: Medium Risk (07/29/2023)   Overall Financial Resource Strain (CARDIA)    Difficulty of Paying Living Expenses: Somewhat hard  Food Insecurity: No Food Insecurity (07/29/2023)   Hunger Vital Sign    Worried About Running Out of Food in the Last Year: Never true    Ran Out of Food in the Last Year: Never true  Transportation Needs: No Transportation Needs (07/29/2023)   PRAPARE - Administrator, Civil Service (Medical): No    Lack of Transportation (Non-Medical): No  Physical Activity: Insufficiently Active (07/29/2023)   Exercise Vital Sign    Days of Exercise per Week: 3 days  Minutes of Exercise per Session: 30 min  Stress: Stress Concern Present (07/29/2023)   [employer] of Occupational Health - Occupational Stress Questionnaire    Feeling of Stress : Rather much  Social Connections: Moderately Integrated (07/29/2023)   Social Connection and Isolation Panel    Frequency of Communication with Friends and Family: More than three times a week    Frequency of Social Gatherings with Friends and Family: Twice a week    Attends Religious Services: 1 to 4 times per year    Active Member of [employer] or Organizations: No    Attends Engineer, structural: Never    Marital Status: Married   Family History   Problem Relation Age of Onset   Cancer Mother        breast   Heart disease Father    Dementia Father    Breast cancer Neg Hx         Objective:   Physical Exam Vitals reviewed.  Constitutional:      General: She is not in acute distress.    Appearance: She is well-developed.  HENT:     Head: Normocephalic and atraumatic.  Eyes:     Pupils: Pupils are equal, round, and reactive to light.  Neck:     Thyroid: No thyromegaly.  Cardiovascular:     Rate and Rhythm: Normal rate and regular rhythm.     Heart sounds: Normal heart sounds. No murmur heard. Pulmonary:     Effort: Pulmonary effort is normal. No respiratory distress.     Breath sounds: Normal breath sounds. No wheezing.  Abdominal:     General: Bowel sounds are normal. There is no distension.     Palpations: Abdomen is soft.     Tenderness: There is no abdominal tenderness.  Musculoskeletal:        General: No tenderness.     Cervical back: Normal range of motion and neck supple.     Comments: Pain in left cervical with rotation  Skin:    General: Skin is warm and dry.  Neurological:     Mental Status: She is alert and oriented to person, place, and time.     Cranial Nerves: No cranial nerve deficit.     Deep Tendon Reflexes: Reflexes are normal and symmetric.  Psychiatric:        Behavior: Behavior normal.        Thought Content: Thought content normal.        Judgment: Judgment normal.       BP (!) 151/89   Pulse 74   Temp 97.8 F (36.6 C)   Ht 5' 4.75 (1.645 m)   Wt 190 lb 6.4 oz (86.4 kg)   SpO2 97%   BMI 31.93 kg/m      Assessment & Plan:  Cheetara Hoge comes in today with chief complaint of Acute Visit (Neck pain radiating down my back. Starting to give me headaches. )   Diagnosis and orders addressed:  1. Cervical radiculopathy (Primary) - baclofen  (LIORESAL ) 10 MG tablet; Take 1 tablet (10 mg total) by mouth 3 (three) times daily.  Dispense: 30 each; Refill: 0 -  diclofenac  (VOLTAREN ) 75 MG EC tablet; Take 1 tablet (75 mg total) by mouth 2 (two) times daily.  Dispense: 60 tablet; Refill: 2 - Ambulatory referral to Orthopedic Surgery - Ambulatory referral to Physical Therapy  2. Bilateral headaches  Rest Ice ROM exercises  Start diclofenac  BID, no other NSAIDs Referral to PT and Ortho  Bari Learn, FNP

## 2024-07-12 ENCOUNTER — Other Ambulatory Visit: Admitting: Obstetrics & Gynecology

## 2024-09-01 ENCOUNTER — Encounter: Payer: Self-pay | Admitting: Family

## 2024-09-04 ENCOUNTER — Other Ambulatory Visit: Payer: Self-pay | Admitting: Family

## 2024-09-04 DIAGNOSIS — M79604 Pain in right leg: Secondary | ICD-10-CM

## 2024-09-04 DIAGNOSIS — G5730 Lesion of lateral popliteal nerve, unspecified lower limb: Secondary | ICD-10-CM

## 2024-09-06 ENCOUNTER — Ambulatory Visit: Admitting: Physical Medicine and Rehabilitation

## 2024-09-27 ENCOUNTER — Ambulatory Visit: Admitting: Physical Medicine and Rehabilitation

## 2024-09-27 ENCOUNTER — Other Ambulatory Visit: Payer: Self-pay | Admitting: Family

## 2024-09-27 ENCOUNTER — Encounter: Payer: Self-pay | Admitting: Physical Medicine and Rehabilitation

## 2024-09-27 DIAGNOSIS — M7918 Myalgia, other site: Secondary | ICD-10-CM

## 2024-09-27 DIAGNOSIS — M542 Cervicalgia: Secondary | ICD-10-CM

## 2024-09-27 DIAGNOSIS — M5412 Radiculopathy, cervical region: Secondary | ICD-10-CM

## 2024-09-27 MED ORDER — METHOCARBAMOL 500 MG PO TABS: 500.0000 mg | ORAL_TABLET | Freq: Three times a day (TID) | ORAL | 0 refills | Status: AC

## 2024-09-27 NOTE — Progress Notes (Signed)
 Ann Anderson - 48 y.o. female MRN [id number]  Date of birth: 01-21-1976  Office Visit Note: Visit Date: 09/27/2024 PCP: Lavell Bari LABOR, FNP Referred by: Lavell Bari LABOR, FNP  Subjective: Chief Complaint  Patient presents with   Neck - Pain   Spine - Pain   HPI: Ann Anderson is a 48 y.o. female who comes in today per the request of Bari Lavell, NP for evaluation of chronic, worsening and severe bilateral neck pain radiating down back. She also reports migraine headache issues that are being treated by her PCP. Pain ongoing since May of this year. No specific aggravating factors that cause her pain to worsen. She describes pain as tight and squeezing sensation, currently rates as 7 out of 10. Some relief of pain with home exercise regimen, rest and use of medications. Good relief of pain with diclofenac  and Tylenol. She does take Gabapentin  for neuropathy. Recent cervical radiographs show subtle degenerative disc disease C4-C5 C5-C6. No spondylolisthesis. No history of formal physical therapy. No history of cervical surgery/injections. No prior cervical MRI imaging. Patient currently working as forensic scientist at [employer], stands for feet for long hours. Patient denies focal weakness, numbness and tingling. No recent trauma or falls.      Review of Systems  Musculoskeletal:  Positive for myalgias and neck pain.  Neurological:  Negative for tingling, sensory change, focal weakness and weakness.  All other systems reviewed and are negative.  Otherwise per HPI.  Assessment & Plan: Visit Diagnoses:    ICD-10-CM   1. Cervicalgia  M54.2     2. Myofascial pain syndrome  M79.18        Plan: Findings:  Chronic bilateral neck pain radiating down back. No radicular pain down the arms. Patient continues to have severe pain despite good conservative therapies such as home exercise regimen, rest and use of medications. Recent cervical radiographs show mild  degenerative changes at C4-C5 and C5-C6. No spondylolisthesis. Patients clinical presentation and exam are consistent with myofascial pain syndrome. Myofascial tenderness and multiple palpable trigger points to bilateral levator scapulae and trapezius regions. We discussed treatment plan in detail today. Next step is to place order for short course of formal physical therapy. I do think she would benefit from manual treatments and possible dry needling. I also discussed medication management, she can continue with diclofenac , I also added Robaxin for her to try. I would like to see her back in approximately 8 weeks for follow up. Should her pain persist would consider obtaining cervical MRI imaging. Her exam today is non focal, good strength noted to bilateral upper extremities.     Meds & Orders:  Meds ordered this encounter  Medications   methocarbamol (ROBAXIN) 500 MG tablet    Sig: Take 1 tablet (500 mg total) by mouth 3 (three) times daily.    Dispense:  90 tablet    Refill:  0   No orders of the defined types were placed in this encounter.   Follow-up: Return for 8 week follow up post physical therapy.   Procedures: No procedures performed      Clinical History: CLINICAL DATA:  Neck pain   EXAM: CERVICAL SPINE - COMPLETE 4+ VIEW   COMPARISON:  None Available.   FINDINGS: Multilevel degenerative disc disease with narrowing C4-C5 C5-C6 with subtle anterior osteophytic changes and posterior bony spondylosis. No foraminal narrowing.   C1-C2 articulation and predental space are normal   Prevertebral soft tissues are normal  Spinolaminar line is intact   IMPRESSION: Subtle degenerative disc disease C4-C5 C5-C6     Electronically Signed   By: Franky Chard M.D.   On: 02/11/2024 16:29   She reports that she has been smoking cigarettes. She has a 3 pack-year smoking history. She has never used smokeless tobacco.  Recent Labs    02/01/24 1510  HGBA1C 5.8*     Objective:  VS:  HT:    WT:   BMI:     BP:   HR: bpm  TEMP: ( )  RESP:  Physical Exam Vitals and nursing note reviewed.  HENT:     Head: Normocephalic and atraumatic.     Right Ear: External ear normal.     Left Ear: External ear normal.     Nose: Nose normal.     Mouth/Throat:     Mouth: Mucous membranes are moist.  Eyes:     Extraocular Movements: Extraocular movements intact.  Cardiovascular:     Rate and Rhythm: Normal rate.     Pulses: Normal pulses.  Pulmonary:     Effort: Pulmonary effort is normal.  Abdominal:     General: Abdomen is flat. There is no distension.  Musculoskeletal:        General: Tenderness present.     Cervical back: Tenderness present.     Comments: No discomfort noted with flexion, extension and side-to-side rotation. Patient has good strength in the upper extremities including 5 out of 5 strength in wrist extension, long finger flexion and APB. Shoulder range of motion is full bilaterally without any sign of impingement. There is no atrophy of the hands intrinsically. Myofascial tenderness noted upon palpation of bilateral levator scapulae and trapezius regions. Sensation intact bilaterally. Negative Hoffman's sign. Negative Spurling's sign.     Skin:    General: Skin is warm and dry.     Capillary Refill: Capillary refill takes less than 2 seconds.  Neurological:     General: No focal deficit present.     Mental Status: She is alert and oriented to person, place, and time.  Psychiatric:        Mood and Affect: Mood normal.        Behavior: Behavior normal.     Ortho Exam  Imaging: No results found.  Past Medical/Family/Surgical/Social History: Medications & Allergies reviewed per EMR, new medications updated. Patient Active Problem List   Diagnosis Date Noted   Unobtainable family history due to adoption 06/30/2022   Overactive bladder 10/08/2021   Essential hypertension 10/08/2021   Stress incontinence of urine 05/13/2021    MDD (major depressive disorder), recurrent episode, moderate (HCC) 12/26/2018   Tobacco abuse 04/06/2018   Venous insufficiency 02/23/2017   Neuropathy 11/13/2016   History of TIAs 10/21/2016   HLD (hyperlipidemia) 06/16/2016   Arthritis, lumbar spine 06/16/2016   Past Medical History:  Diagnosis Date   Degenerative disc disease, lumbar    Hyperlipidemia    Hypertension    Neuropathy    Family History  Problem Relation Age of Onset   Cancer Mother        breast   Heart disease Father    Dementia Father    Breast cancer Neg Hx    Past Surgical History:  Procedure Laterality Date   TUBAL LIGATION  2005   Social History   Occupational History   Occupation: Designer, Jewell[employer]  Tobacco Use   Smoking status: Every Day    Current packs/day: 0.50    Average packs/day: 0.5  packs/day for 6.0 years (3.0 ttl pk-yrs)    Types: Cigarettes   Smokeless tobacco: Never   Tobacco comments:    Restarted 2 years ago and trying to cut back.   Vaping Use   Vaping status: Every Day   Substances: Nicotine  Substance and Sexual Activity   Alcohol use: Yes    Comment: Social   Drug use: Never   Sexual activity: Not Currently    Birth control/protection: Surgical

## 2024-09-27 NOTE — Progress Notes (Signed)
 Pain Scale   Average Pain 7 Patient advised she has neck pain radiating to entire back        +Driver, -BT, -Dye Allergies.

## 2024-10-09 ENCOUNTER — Ambulatory Visit: Admitting: Family

## 2024-10-09 ENCOUNTER — Encounter: Payer: Self-pay | Admitting: Family

## 2024-10-09 VITALS — BP 128/86 | HR 79 | Temp 97.4°F | Ht 63.0 in | Wt 190.0 lb

## 2024-10-09 DIAGNOSIS — I10 Essential (primary) hypertension: Secondary | ICD-10-CM

## 2024-10-09 DIAGNOSIS — M47816 Spondylosis without myelopathy or radiculopathy, lumbar region: Secondary | ICD-10-CM

## 2024-10-09 DIAGNOSIS — G629 Polyneuropathy, unspecified: Secondary | ICD-10-CM | POA: Diagnosis not present

## 2024-10-09 DIAGNOSIS — N3281 Overactive bladder: Secondary | ICD-10-CM

## 2024-10-09 DIAGNOSIS — F331 Major depressive disorder, recurrent, moderate: Secondary | ICD-10-CM

## 2024-10-09 DIAGNOSIS — M5412 Radiculopathy, cervical region: Secondary | ICD-10-CM

## 2024-10-09 DIAGNOSIS — E785 Hyperlipidemia, unspecified: Secondary | ICD-10-CM | POA: Diagnosis not present

## 2024-10-09 MED ORDER — DICLOFENAC SODIUM 75 MG PO TBEC: 75.0000 mg | DELAYED_RELEASE_TABLET | Freq: Two times a day (BID) | ORAL | 4 refills | Status: AC

## 2024-10-09 MED ORDER — ATORVASTATIN CALCIUM 20 MG PO TABS: 20.0000 mg | ORAL_TABLET | Freq: Every day | ORAL | 2 refills | Status: AC

## 2024-10-09 MED ORDER — HYDROCHLOROTHIAZIDE 25 MG PO TABS: 25.0000 mg | ORAL_TABLET | Freq: Every day | ORAL | 3 refills | Status: AC

## 2024-10-09 NOTE — Progress Notes (Signed)
 Subjective:    Patient ID: Ann Anderson, female    DOB: 04/04/1976, 48 y.o.   MRN: [id number]  Chief Complaint  Patient presents with   Medical Management of Chronic Issues   PT presents to the office today for chronic follow up.   She has neuropathy pain of burning pain in bilateral legs of 6 out 10. She is taking Gabapentin  100 mg  BID with moderate relief.   She quit smoking 09/09/24. Hypertension This is a chronic problem. The current episode started more than 1 year ago. The problem has been waxing and waning since onset. The problem is uncontrolled. Associated symptoms include malaise/fatigue and peripheral edema. Pertinent negatives include no shortness of breath. Risk factors for coronary artery disease include dyslipidemia, obesity, sedentary lifestyle and smoking/tobacco exposure. The current treatment provides moderate improvement.  Arthritis Presents for follow-up visit. She complains of pain and stiffness. The symptoms have been stable. Affected locations include the right knee, left knee, right hip and left hip. Her pain is at a severity of 7/10.  Urinary Frequency  This is a chronic problem. The current episode started more than 1 year ago. The problem occurs intermittently. The patient is experiencing no pain. Associated symptoms include frequency.  Depression        This is a chronic problem.  The current episode started more than 1 year ago.   The onset quality is gradual.   The problem occurs intermittently.  Associated symptoms include decreased interest and sad.  Associated symptoms include no helplessness and no hopelessness.  Past treatments include nothing. Hyperlipidemia This is a chronic problem. The current episode started more than 1 year ago. The problem is controlled. Recent lipid tests were reviewed and are normal. Pertinent negatives include no shortness of breath. Current antihyperlipidemic treatment includes statins. The current treatment provides  moderate improvement of lipids. Risk factors for coronary artery disease include dyslipidemia, hypertension, a sedentary lifestyle and post-menopausal.       Review of Systems  Constitutional:  Positive for malaise/fatigue.  Respiratory:  Negative for shortness of breath.   Genitourinary:  Positive for frequency.  Musculoskeletal:  Positive for stiffness.  All other systems reviewed and are negative.  Family History  Problem Relation Age of Onset   Cancer Mother        breast   Heart disease Father    Dementia Father    Breast cancer Neg Hx    Social History   Socioeconomic History   Marital status: Married    Spouse name: Not on file   Number of children: 1   Years of education: Not on file   Highest education level: Not on file  Occupational History   Occupation: [employer]  Tobacco Use   Smoking status: Former    Current packs/day: 0.00    Average packs/day: 0.5 packs/day for 6.0 years (3.0 ttl pk-yrs)    Types: Cigarettes    Quit date: 08/2024    Years since quitting: 0.1   Smokeless tobacco: Never   Tobacco comments:    Restarted 2 years ago and trying to cut back.   Vaping Use   Vaping status: Every Day   Substances: Nicotine  Substance and Sexual Activity   Alcohol use: Yes    Comment: Social   Drug use: Never   Sexual activity: Not Currently    Birth control/protection: Surgical  Other Topics Concern   Not on file  Social History Narrative   Right handed   Lives  in a single story home with daughter and cousins   Works at [employer]   Caffeine - trying to cut back now mountain dew 2 cans/day; coffee 6-18 oz   Assoc. Degree   Exercise at work - walks   Social Drivers of Health   Tobacco Use: Medium Risk (10/09/2024)   Patient History    Smoking Tobacco Use: Former    Smokeless Tobacco Use: Never    Passive Exposure: Not on file  Financial Resource Strain: Medium Risk (07/29/2023)   Overall Financial Resource Strain (CARDIA)    Difficulty  of Paying Living Expenses: Somewhat hard  Food Insecurity: No Food Insecurity (07/29/2023)   Hunger Vital Sign    Worried About Running Out of Food in the Last Year: Never true    Ran Out of Food in the Last Year: Never true  Transportation Needs: No Transportation Needs (07/29/2023)   PRAPARE - Administrator, Civil Service (Medical): No    Lack of Transportation (Non-Medical): No  Physical Activity: Insufficiently Active (07/29/2023)   Exercise Vital Sign    Days of Exercise per Week: 3 days    Minutes of Exercise per Session: 30 min  Stress: Stress Concern Present (07/29/2023)   [employer] of Occupational Health - Occupational Stress Questionnaire    Feeling of Stress : Rather much  Social Connections: Moderately Integrated (07/29/2023)   Social Connection and Isolation Panel    Frequency of Communication with Friends and Family: More than three times a week    Frequency of Social Gatherings with Friends and Family: Twice a week    Attends Religious Services: 1 to 4 times per year    Active Member of Clubs or Organizations: No    Attends Banker Meetings: Never    Marital Status: Married  Depression (PHQ2-9): Medium Risk (10/09/2024)   Depression (PHQ2-9)    PHQ-2 Score: 5  Alcohol Screen: Low Risk (07/29/2023)   Alcohol Screen    Last Alcohol Screening Score (AUDIT): 1  Housing: Low Risk (07/29/2023)   Housing    Last Housing Risk Score: 0  Utilities: Not At Risk (07/29/2023)   AHC Utilities    Threatened with loss of utilities: No  Health Literacy: Adequate Health Literacy (07/29/2023)   B1300 Health Literacy    Frequency of need for help with medical instructions: Rarely       Objective:   Physical Exam Vitals reviewed.  Constitutional:      General: She is not in acute distress.    Appearance: She is well-developed. She is obese.  HENT:     Head: Normocephalic and atraumatic.     Right Ear: Tympanic membrane normal.     Left Ear:  Tympanic membrane normal.  Eyes:     Pupils: Pupils are equal, round, and reactive to light.  Neck:     Thyroid: No thyromegaly.  Cardiovascular:     Rate and Rhythm: Normal rate and regular rhythm.     Heart sounds: Normal heart sounds. No murmur heard. Pulmonary:     Effort: Pulmonary effort is normal. No respiratory distress.     Breath sounds: Normal breath sounds. No wheezing.  Abdominal:     General: Bowel sounds are normal. There is no distension.     Palpations: Abdomen is soft.     Tenderness: There is no abdominal tenderness.  Musculoskeletal:        General: No tenderness. Normal range of motion.     Cervical back:  Normal range of motion and neck supple.     Right lower leg: Edema (trace) present.     Left lower leg: Edema (trace) present.  Skin:    General: Skin is warm and dry.  Neurological:     Mental Status: She is alert and oriented to person, place, and time.     Cranial Nerves: No cranial nerve deficit.     Deep Tendon Reflexes: Reflexes are normal and symmetric.  Psychiatric:        Behavior: Behavior normal.        Thought Content: Thought content normal.        Judgment: Judgment normal.       BP 128/86   Pulse 79   Temp (!) 97.4 F (36.3 C) (Temporal)   Ht 5' 3 (1.6 m)   Wt 190 lb (86.2 kg)   BMI 33.66 kg/m   Assessment & Plan:  Ann Anderson comes in today with chief complaint of Medical Management of Chronic Issues   Diagnosis and orders addressed:  1. Hyperlipidemia, unspecified hyperlipidemia type - atorvastatin  (LIPITOR) 20 MG tablet; Take 1 tablet (20 mg total) by mouth daily.  Dispense: 90 tablet; Refill: 2 - CMP14+EGFR  2. Cervical radiculopathy - diclofenac  (VOLTAREN ) 75 MG EC tablet; Take 1 tablet (75 mg total) by mouth 2 (two) times daily.  Dispense: 180 tablet; Refill: 4 - CMP14+EGFR  3. Primary hypertension - hydrochlorothiazide  (HYDRODIURIL ) 25 MG tablet; Take 1 tablet (25 mg total) by mouth daily.  Dispense: 90  tablet; Refill: 3 - CMP14+EGFR  4. Essential hypertension (Primary) - CMP14+EGFR  5. Neuropathy - CMP14+EGFR  6. MDD (major depressive disorder), recurrent episode, moderate (HCC) - CMP14+EGFR  7. Overactive bladder - CMP14+EGFR  8. Arthritis, lumbar spine - CMP14+EGFR  Labs pending Health Maintenance reviewed Diet and exercise encouraged  Follow up plan: 6 months    Bari Learn, FNP

## 2024-10-09 NOTE — Patient Instructions (Signed)
 Hypertension, Adult High blood pressure (hypertension) is when the force of blood pumping through the arteries is too strong. The arteries are the blood vessels that carry blood from the heart throughout the body. Hypertension forces the heart to work harder to pump blood and may cause arteries to become narrow or stiff. Untreated or uncontrolled hypertension can lead to a heart attack, heart failure, a stroke, kidney disease, and other problems. A blood pressure reading consists of a higher number over a lower number. Ideally, your blood pressure should be below 120/80. The first ("top") number is called the systolic pressure. It is a measure of the pressure in your arteries as your heart beats. The second ("bottom") number is called the diastolic pressure. It is a measure of the pressure in your arteries as the heart relaxes. What are the causes? The exact cause of this condition is not known. There are some conditions that result in high blood pressure. What increases the risk? Certain factors may make you more likely to develop high blood pressure. Some of these risk factors are under your control, including: Smoking. Not getting enough exercise or physical activity. Being overweight. Having too much fat, sugar, calories, or salt (sodium) in your diet. Drinking too much alcohol. Other risk factors include: Having a personal history of heart disease, diabetes, high cholesterol, or kidney disease. Stress. Having a family history of high blood pressure and high cholesterol. Having obstructive sleep apnea. Age. The risk increases with age. What are the signs or symptoms? High blood pressure may not cause symptoms. Very high blood pressure (hypertensive crisis) may cause: Headache. Fast or irregular heartbeats (palpitations). Shortness of breath. Nosebleed. Nausea and vomiting. Vision changes. Severe chest pain, dizziness, and seizures. How is this diagnosed? This condition is diagnosed by  measuring your blood pressure while you are seated, with your arm resting on a flat surface, your legs uncrossed, and your feet flat on the floor. The cuff of the blood pressure monitor will be placed directly against the skin of your upper arm at the level of your heart. Blood pressure should be measured at least twice using the same arm. Certain conditions can cause a difference in blood pressure between your right and left arms. If you have a high blood pressure reading during one visit or you have normal blood pressure with other risk factors, you may be asked to: Return on a different day to have your blood pressure checked again. Monitor your blood pressure at home for 1 week or longer. If you are diagnosed with hypertension, you may have other blood or imaging tests to help your health care provider understand your overall risk for other conditions. How is this treated? This condition is treated by making healthy lifestyle changes, such as eating healthy foods, exercising more, and reducing your alcohol intake. You may be referred for counseling on a healthy diet and physical activity. Your health care provider may prescribe medicine if lifestyle changes are not enough to get your blood pressure under control and if: Your systolic blood pressure is above 130. Your diastolic blood pressure is above 80. Your personal target blood pressure may vary depending on your medical conditions, your age, and other factors. Follow these instructions at home: Eating and drinking  Eat a diet that is high in fiber and potassium, and low in sodium, added sugar, and fat. An example of this eating plan is called the DASH diet. DASH stands for Dietary Approaches to Stop Hypertension. To eat this way: Eat  plenty of fresh fruits and vegetables. Try to fill one half of your plate at each meal with fruits and vegetables. Eat whole grains, such as whole-wheat pasta, brown rice, or whole-grain bread. Fill about one  fourth of your plate with whole grains. Eat or drink low-fat dairy products, such as skim milk or low-fat yogurt. Avoid fatty cuts of meat, processed or cured meats, and poultry with skin. Fill about one fourth of your plate with lean proteins, such as fish, chicken without skin, beans, eggs, or tofu. Avoid pre-made and processed foods. These tend to be higher in sodium, added sugar, and fat. Reduce your daily sodium intake. Many people with hypertension should eat less than 1,500 mg of sodium a day. Do not drink alcohol if: Your health care provider tells you not to drink. You are pregnant, may be pregnant, or are planning to become pregnant. If you drink alcohol: Limit how much you have to: 0-1 drink a day for women. 0-2 drinks a day for men. Know how much alcohol is in your drink. In the [place] one drink equals one 12 oz bottle of beer (355 mL), one 5 oz glass of wine (148 mL), or one 1 oz glass of hard liquor (44 mL). Lifestyle  Work with your health care provider to maintain a healthy body weight or to lose weight. Ask what an ideal weight is for you. Get at least 30 minutes of exercise that causes your heart to beat faster (aerobic exercise) most days of the week. Activities may include walking, swimming, or biking. Include exercise to strengthen your muscles (resistance exercise), such as Pilates or lifting weights, as part of your weekly exercise routine. Try to do these types of exercises for 30 minutes at least 3 days a week. Do not use any products that contain nicotine or tobacco. These products include cigarettes, chewing tobacco, and vaping devices, such as e-cigarettes. If you need help quitting, ask your health care provider. Monitor your blood pressure at home as told by your health care provider. Keep all follow-up visits. This is important. Medicines Take over-the-counter and prescription medicines only as told by your health care provider. Follow directions carefully. Blood  pressure medicines must be taken as prescribed. Do not skip doses of blood pressure medicine. Doing this puts you at risk for problems and can make the medicine less effective. Ask your health care provider about side effects or reactions to medicines that you should watch for. Contact a health care provider if you: Think you are having a reaction to a medicine you are taking. Have headaches that keep coming back (recurring). Feel dizzy. Have swelling in your ankles. Have trouble with your vision. Get help right away if you: Develop a severe headache or confusion. Have unusual weakness or numbness. Feel faint. Have severe pain in your chest or abdomen. Vomit repeatedly. Have trouble breathing. These symptoms may be an emergency. Get help right away. Call 911. Do not wait to see if the symptoms will go away. Do not drive yourself to the hospital. Summary Hypertension is when the force of blood pumping through your arteries is too strong. If this condition is not controlled, it may put you at risk for serious complications. Your personal target blood pressure may vary depending on your medical conditions, your age, and other factors. For most people, a normal blood pressure is less than 120/80. Hypertension is treated with lifestyle changes, medicines, or a combination of both. Lifestyle changes include losing weight, eating a healthy,  low-sodium diet, exercising more, and limiting alcohol. This information is not intended to replace advice given to you by your health care provider. Make sure you discuss any questions you have with your health care provider. Document Revised: 08/19/2021 Document Reviewed: 08/19/2021 Elsevier Patient Education  2024 [employer].

## 2024-10-10 ENCOUNTER — Ambulatory Visit: Payer: Self-pay | Admitting: Family

## 2024-10-10 LAB — CMP14+EGFR
ALT: 23 IU/L (ref 0–32)
AST: 14 IU/L (ref 0–40)
Albumin: 4.5 g/dL (ref 3.9–4.9)
Alkaline Phosphatase: 49 IU/L (ref 41–116)
BUN/Creatinine Ratio: 16 (ref 9–23)
BUN: 13 mg/dL (ref 6–24)
Bilirubin Total: 0.6 mg/dL (ref 0.0–1.2)
CO2: 20 mmol/L (ref 20–29)
Calcium: 9.4 mg/dL (ref 8.7–10.2)
Chloride: 103 mmol/L (ref 96–106)
Creatinine, Ser: 0.82 mg/dL (ref 0.57–1.00)
Globulin, Total: 2.4 g/dL (ref 1.5–4.5)
Glucose: 87 mg/dL (ref 70–99)
Potassium: 4.4 mmol/L (ref 3.5–5.2)
Sodium: 138 mmol/L (ref 134–144)
Total Protein: 6.9 g/dL (ref 6.0–8.5)
eGFR: 88 mL/min/1.73 (ref >59)

## 2024-10-30 ENCOUNTER — Encounter: Payer: Self-pay | Admitting: Obstetrics & Gynecology

## 2024-11-01 ENCOUNTER — Other Ambulatory Visit: Payer: Self-pay | Admitting: Obstetrics & Gynecology

## 2024-11-01 MED ORDER — NORETHINDRONE 0.35 MG PO TABS: ORAL_TABLET | ORAL | 6 refills | Status: AC

## 2024-11-01 NOTE — Progress Notes (Signed)
 Change from Aygestin  to micronor  2 tabs daily due to cost  Ann Kolton, DO Attending Obstetrician & Gynecologist, [facility] for [employer], [facility]

## 2024-11-22 ENCOUNTER — Ambulatory Visit: Admitting: Physical Medicine and Rehabilitation
